# Patient Record
Sex: Female | Born: 1945 | Race: White | Hispanic: No | Marital: Married | Smoking: Former smoker
Health system: Southern US, Community
[De-identification: ages and names within clinical notes are randomized; demographics above are authoritative.]

## PROBLEM LIST (undated history)

## (undated) DIAGNOSIS — K219 Gastro-esophageal reflux disease without esophagitis: Secondary | ICD-10-CM

## (undated) DIAGNOSIS — K3184 Gastroparesis: Secondary | ICD-10-CM

## (undated) DIAGNOSIS — E079 Disorder of thyroid, unspecified: Secondary | ICD-10-CM

## (undated) DIAGNOSIS — D649 Anemia, unspecified: Secondary | ICD-10-CM

## (undated) DIAGNOSIS — D219 Benign neoplasm of connective and other soft tissue, unspecified: Secondary | ICD-10-CM

## (undated) DIAGNOSIS — I10 Essential (primary) hypertension: Secondary | ICD-10-CM

## (undated) DIAGNOSIS — E039 Hypothyroidism, unspecified: Secondary | ICD-10-CM

## (undated) DIAGNOSIS — K6389 Other specified diseases of intestine: Secondary | ICD-10-CM

## (undated) DIAGNOSIS — C801 Malignant (primary) neoplasm, unspecified: Secondary | ICD-10-CM

## (undated) DIAGNOSIS — F419 Anxiety disorder, unspecified: Secondary | ICD-10-CM

## (undated) DIAGNOSIS — K638219 Small intestinal bacterial overgrowth, unspecified: Secondary | ICD-10-CM

## (undated) DIAGNOSIS — E119 Type 2 diabetes mellitus without complications: Secondary | ICD-10-CM

## (undated) DIAGNOSIS — F4310 Post-traumatic stress disorder, unspecified: Secondary | ICD-10-CM

## (undated) DIAGNOSIS — F32A Depression, unspecified: Secondary | ICD-10-CM

## (undated) DIAGNOSIS — F329 Major depressive disorder, single episode, unspecified: Secondary | ICD-10-CM

## (undated) DIAGNOSIS — N289 Disorder of kidney and ureter, unspecified: Secondary | ICD-10-CM

## (undated) HISTORY — PX: PARS PLANA VITRECTOMY: SHX2166

## (undated) HISTORY — DX: Type 2 diabetes mellitus without complications: E11.9

## (undated) HISTORY — DX: Small intestinal bacterial overgrowth, unspecified: K63.8219

## (undated) HISTORY — DX: Post-traumatic stress disorder, unspecified: F43.10

## (undated) HISTORY — DX: Other specified diseases of intestine: K63.89

## (undated) HISTORY — DX: Anxiety disorder, unspecified: F41.9

## (undated) HISTORY — DX: Disorder of thyroid, unspecified: E07.9

## (undated) HISTORY — DX: Disorder of kidney and ureter, unspecified: N28.9

## (undated) HISTORY — PX: CATARACT EXTRACTION: SUR2

## (undated) HISTORY — DX: Benign neoplasm of connective and other soft tissue, unspecified: D21.9

## (undated) HISTORY — PX: ROTATOR CUFF REPAIR: SHX139

## (undated) HISTORY — PX: ENDOMETRIAL ABLATION: SHX621

## (undated) HISTORY — DX: Gastroparesis: K31.84

## (undated) HISTORY — DX: Malignant (primary) neoplasm, unspecified: C80.1

## (undated) HISTORY — PX: CARPAL TUNNEL RELEASE: SHX101

## (undated) HISTORY — DX: Anemia, unspecified: D64.9

## (undated) HISTORY — DX: Essential (primary) hypertension: I10

---

## 1996-12-24 HISTORY — PX: BREAST BIOPSY: SHX20

## 1997-06-27 ENCOUNTER — Ambulatory Visit (HOSPITAL_COMMUNITY): Admission: RE | Admit: 1997-06-27 | Discharge: 1997-06-27 | Payer: Self-pay | Admitting: *Deleted

## 1997-07-12 ENCOUNTER — Ambulatory Visit (HOSPITAL_COMMUNITY): Admission: RE | Admit: 1997-07-12 | Discharge: 1997-07-12 | Payer: Self-pay | Admitting: *Deleted

## 1997-08-24 ENCOUNTER — Encounter: Admission: RE | Admit: 1997-08-24 | Discharge: 1997-11-22 | Payer: Self-pay | Admitting: Orthopaedic Surgery

## 1997-09-05 ENCOUNTER — Encounter: Admission: RE | Admit: 1997-09-05 | Discharge: 1997-12-04 | Payer: Self-pay | Admitting: Internal Medicine

## 1998-02-16 ENCOUNTER — Ambulatory Visit (HOSPITAL_COMMUNITY): Admission: RE | Admit: 1998-02-16 | Discharge: 1998-02-16 | Payer: Self-pay | Admitting: *Deleted

## 1998-07-02 ENCOUNTER — Other Ambulatory Visit: Admission: RE | Admit: 1998-07-02 | Discharge: 1998-07-02 | Payer: Self-pay | Admitting: *Deleted

## 1998-07-20 ENCOUNTER — Ambulatory Visit (HOSPITAL_COMMUNITY): Admission: RE | Admit: 1998-07-20 | Discharge: 1998-07-20 | Payer: Self-pay | Admitting: Ophthalmology

## 1998-07-20 ENCOUNTER — Encounter: Payer: Self-pay | Admitting: Ophthalmology

## 1998-09-11 ENCOUNTER — Ambulatory Visit (HOSPITAL_COMMUNITY): Admission: RE | Admit: 1998-09-11 | Discharge: 1998-09-11 | Payer: Self-pay | Admitting: Cardiology

## 1999-01-25 ENCOUNTER — Ambulatory Visit (HOSPITAL_COMMUNITY): Admission: RE | Admit: 1999-01-25 | Discharge: 1999-01-26 | Payer: Self-pay | Admitting: Ophthalmology

## 1999-02-19 ENCOUNTER — Ambulatory Visit (HOSPITAL_COMMUNITY): Admission: RE | Admit: 1999-02-19 | Discharge: 1999-02-19 | Payer: Self-pay | Admitting: *Deleted

## 1999-02-19 ENCOUNTER — Encounter: Payer: Self-pay | Admitting: *Deleted

## 1999-04-08 ENCOUNTER — Other Ambulatory Visit: Admission: RE | Admit: 1999-04-08 | Discharge: 1999-04-08 | Payer: Self-pay | Admitting: *Deleted

## 1999-10-01 ENCOUNTER — Ambulatory Visit (HOSPITAL_BASED_OUTPATIENT_CLINIC_OR_DEPARTMENT_OTHER): Admission: RE | Admit: 1999-10-01 | Discharge: 1999-10-01 | Payer: Self-pay | Admitting: Orthopaedic Surgery

## 2000-02-20 ENCOUNTER — Encounter: Payer: Self-pay | Admitting: *Deleted

## 2000-02-20 ENCOUNTER — Ambulatory Visit (HOSPITAL_COMMUNITY): Admission: RE | Admit: 2000-02-20 | Discharge: 2000-02-20 | Payer: Self-pay | Admitting: *Deleted

## 2000-02-26 ENCOUNTER — Encounter: Admission: RE | Admit: 2000-02-26 | Discharge: 2000-02-26 | Payer: Self-pay | Admitting: *Deleted

## 2000-02-26 ENCOUNTER — Encounter: Payer: Self-pay | Admitting: *Deleted

## 2000-03-05 ENCOUNTER — Ambulatory Visit (HOSPITAL_BASED_OUTPATIENT_CLINIC_OR_DEPARTMENT_OTHER): Admission: RE | Admit: 2000-03-05 | Discharge: 2000-03-05 | Payer: Self-pay | Admitting: Orthopaedic Surgery

## 2000-03-09 ENCOUNTER — Other Ambulatory Visit: Admission: RE | Admit: 2000-03-09 | Discharge: 2000-03-09 | Payer: Self-pay | Admitting: *Deleted

## 2000-04-09 ENCOUNTER — Ambulatory Visit (HOSPITAL_COMMUNITY): Admission: RE | Admit: 2000-04-09 | Discharge: 2000-04-09 | Payer: Self-pay | Admitting: *Deleted

## 2000-08-20 ENCOUNTER — Encounter: Admission: RE | Admit: 2000-08-20 | Discharge: 2000-08-20 | Payer: Self-pay | Admitting: Orthopaedic Surgery

## 2000-08-20 ENCOUNTER — Encounter: Payer: Self-pay | Admitting: Orthopaedic Surgery

## 2000-11-05 ENCOUNTER — Ambulatory Visit (HOSPITAL_BASED_OUTPATIENT_CLINIC_OR_DEPARTMENT_OTHER): Admission: RE | Admit: 2000-11-05 | Discharge: 2000-11-05 | Payer: Self-pay | Admitting: Orthopaedic Surgery

## 2001-02-19 ENCOUNTER — Other Ambulatory Visit: Admission: RE | Admit: 2001-02-19 | Discharge: 2001-02-19 | Payer: Self-pay | Admitting: *Deleted

## 2001-02-26 ENCOUNTER — Encounter: Payer: Self-pay | Admitting: *Deleted

## 2001-02-26 ENCOUNTER — Encounter: Admission: RE | Admit: 2001-02-26 | Discharge: 2001-02-26 | Payer: Self-pay | Admitting: *Deleted

## 2001-12-30 ENCOUNTER — Encounter: Payer: Self-pay | Admitting: Ophthalmology

## 2001-12-31 ENCOUNTER — Ambulatory Visit (HOSPITAL_COMMUNITY): Admission: RE | Admit: 2001-12-31 | Discharge: 2001-12-31 | Payer: Self-pay | Admitting: Ophthalmology

## 2002-01-27 ENCOUNTER — Other Ambulatory Visit: Admission: RE | Admit: 2002-01-27 | Discharge: 2002-01-27 | Payer: Self-pay | Admitting: *Deleted

## 2002-03-08 ENCOUNTER — Encounter: Payer: Self-pay | Admitting: *Deleted

## 2002-03-08 ENCOUNTER — Encounter: Admission: RE | Admit: 2002-03-08 | Discharge: 2002-03-08 | Payer: Self-pay | Admitting: *Deleted

## 2003-02-08 ENCOUNTER — Other Ambulatory Visit: Admission: RE | Admit: 2003-02-08 | Discharge: 2003-02-08 | Payer: Self-pay | Admitting: *Deleted

## 2003-03-14 ENCOUNTER — Encounter: Admission: RE | Admit: 2003-03-14 | Discharge: 2003-03-14 | Payer: Self-pay | Admitting: *Deleted

## 2003-03-14 ENCOUNTER — Encounter: Payer: Self-pay | Admitting: *Deleted

## 2004-02-15 ENCOUNTER — Other Ambulatory Visit: Admission: RE | Admit: 2004-02-15 | Discharge: 2004-02-15 | Payer: Self-pay | Admitting: *Deleted

## 2004-03-15 ENCOUNTER — Encounter: Admission: RE | Admit: 2004-03-15 | Discharge: 2004-03-15 | Payer: Self-pay | Admitting: *Deleted

## 2005-03-27 ENCOUNTER — Encounter: Admission: RE | Admit: 2005-03-27 | Discharge: 2005-03-27 | Payer: Self-pay | Admitting: Internal Medicine

## 2005-07-18 ENCOUNTER — Ambulatory Visit (HOSPITAL_COMMUNITY): Admission: RE | Admit: 2005-07-18 | Discharge: 2005-07-18 | Payer: Self-pay | Admitting: Gastroenterology

## 2005-09-24 ENCOUNTER — Encounter: Payer: Self-pay | Admitting: *Deleted

## 2005-11-03 ENCOUNTER — Emergency Department (HOSPITAL_COMMUNITY): Admission: EM | Admit: 2005-11-03 | Discharge: 2005-11-03 | Payer: Self-pay | Admitting: Emergency Medicine

## 2005-11-21 ENCOUNTER — Ambulatory Visit (HOSPITAL_COMMUNITY): Admission: RE | Admit: 2005-11-21 | Discharge: 2005-11-21 | Payer: Self-pay | Admitting: Gastroenterology

## 2006-01-21 ENCOUNTER — Ambulatory Visit (HOSPITAL_COMMUNITY): Admission: RE | Admit: 2006-01-21 | Discharge: 2006-01-21 | Payer: Self-pay | Admitting: Gastroenterology

## 2006-01-23 ENCOUNTER — Ambulatory Visit (HOSPITAL_COMMUNITY): Admission: RE | Admit: 2006-01-23 | Discharge: 2006-01-23 | Payer: Self-pay | Admitting: Gastroenterology

## 2006-01-23 ENCOUNTER — Encounter (INDEPENDENT_AMBULATORY_CARE_PROVIDER_SITE_OTHER): Payer: Self-pay | Admitting: Specialist

## 2006-02-18 ENCOUNTER — Other Ambulatory Visit: Admission: RE | Admit: 2006-02-18 | Discharge: 2006-02-18 | Payer: Self-pay | Admitting: Obstetrics & Gynecology

## 2006-03-30 ENCOUNTER — Encounter: Admission: RE | Admit: 2006-03-30 | Discharge: 2006-03-30 | Payer: Self-pay | Admitting: Obstetrics & Gynecology

## 2006-07-21 ENCOUNTER — Ambulatory Visit (HOSPITAL_COMMUNITY): Admission: RE | Admit: 2006-07-21 | Discharge: 2006-07-21 | Payer: Self-pay | Admitting: Gastroenterology

## 2007-03-05 ENCOUNTER — Other Ambulatory Visit: Admission: RE | Admit: 2007-03-05 | Discharge: 2007-03-05 | Payer: Self-pay | Admitting: Obstetrics & Gynecology

## 2007-03-10 ENCOUNTER — Encounter: Admission: RE | Admit: 2007-03-10 | Discharge: 2007-03-10 | Payer: Self-pay | Admitting: Obstetrics & Gynecology

## 2007-05-31 ENCOUNTER — Encounter: Admission: RE | Admit: 2007-05-31 | Discharge: 2007-05-31 | Payer: Self-pay | Admitting: Obstetrics & Gynecology

## 2008-01-19 ENCOUNTER — Emergency Department (HOSPITAL_COMMUNITY): Admission: EM | Admit: 2008-01-19 | Discharge: 2008-01-19 | Payer: Self-pay | Admitting: *Deleted

## 2008-05-01 ENCOUNTER — Other Ambulatory Visit: Admission: RE | Admit: 2008-05-01 | Discharge: 2008-05-01 | Payer: Self-pay | Admitting: Obstetrics & Gynecology

## 2008-06-01 ENCOUNTER — Encounter: Admission: RE | Admit: 2008-06-01 | Discharge: 2008-06-01 | Payer: Self-pay | Admitting: Obstetrics & Gynecology

## 2008-10-09 ENCOUNTER — Emergency Department (HOSPITAL_COMMUNITY): Admission: EM | Admit: 2008-10-09 | Discharge: 2008-10-09 | Payer: Self-pay | Admitting: Emergency Medicine

## 2009-05-05 ENCOUNTER — Ambulatory Visit (HOSPITAL_COMMUNITY): Admission: RE | Admit: 2009-05-05 | Discharge: 2009-05-05 | Payer: Self-pay | Admitting: Orthopaedic Surgery

## 2009-07-05 ENCOUNTER — Encounter: Admission: RE | Admit: 2009-07-05 | Discharge: 2009-07-05 | Payer: Self-pay | Admitting: Internal Medicine

## 2009-07-13 ENCOUNTER — Encounter: Admission: RE | Admit: 2009-07-13 | Discharge: 2009-07-13 | Payer: Self-pay | Admitting: Internal Medicine

## 2009-07-23 ENCOUNTER — Ambulatory Visit (HOSPITAL_COMMUNITY): Admission: RE | Admit: 2009-07-23 | Discharge: 2009-07-23 | Payer: Self-pay | Admitting: Internal Medicine

## 2009-08-09 ENCOUNTER — Encounter: Admission: RE | Admit: 2009-08-09 | Discharge: 2009-08-09 | Payer: Self-pay | Admitting: Surgery

## 2009-08-09 ENCOUNTER — Ambulatory Visit (HOSPITAL_BASED_OUTPATIENT_CLINIC_OR_DEPARTMENT_OTHER): Admission: RE | Admit: 2009-08-09 | Discharge: 2009-08-09 | Payer: Self-pay | Admitting: Surgery

## 2010-06-16 ENCOUNTER — Encounter: Payer: Self-pay | Admitting: Gastroenterology

## 2010-08-16 LAB — BASIC METABOLIC PANEL
BUN: 21 mg/dL (ref 6–23)
CO2: 24 mEq/L (ref 19–32)
Calcium: 8.8 mg/dL (ref 8.4–10.5)
Chloride: 105 mEq/L (ref 96–112)
Creatinine, Ser: 1.14 mg/dL (ref 0.4–1.2)
GFR calc Af Amer: 58 mL/min — ABNORMAL LOW (ref >60)
GFR calc non Af Amer: 48 mL/min — ABNORMAL LOW (ref >60)
Glucose, Bld: 228 mg/dL — ABNORMAL HIGH (ref 70–99)
Potassium: 4.9 mEq/L (ref 3.5–5.1)
Sodium: 134 mEq/L — ABNORMAL LOW (ref 135–145)

## 2010-08-16 LAB — POCT HEMOGLOBIN-HEMACUE: Hemoglobin: 14.1 g/dL (ref 12.0–15.0)

## 2010-08-16 LAB — GLUCOSE, CAPILLARY: Glucose-Capillary: 181 mg/dL — ABNORMAL HIGH (ref 70–99)

## 2010-10-11 NOTE — Op Note (Signed)
NAME:  KARL, STAUNTON                ACCOUNT NO.:  [id number]   MEDICAL RECORD NO.:  [id number]          PATIENT TYPE:  AMB   LOCATION:  ENDO                         FACILITY:  [facility]   PHYSICIAN:  Lear Ng, MDDATE OF BIRTH:  01/16/1946   DATE OF PROCEDURE:  11/21/2005  DATE OF DISCHARGE:                                 OPERATIVE REPORT   PROCEDURE:  Colonoscopy.   INDICATIONS:  Family history of colon cancer in her grandmother.   MEDICATIONS:  Fentanyl 25 mcg IV, additional medicines given for the  preceding EGD.   FINDINGS:  Rectal exam was normal.  An adult adjustable colonoscope was  inserted into a poorly prepped colon and advanced to 30 cm which continued  to reveal semisolid and solid stool throughout the colon.  Due to the poor  prep, the decision was made to terminate the procedure at this time.   ASSESSMENT:  Poor prep.   PLAN:  Reschedule with TriLyte plus Amitiza or TriLyte plus magnesium  citrate at a later date.      Lear Ng, MD  Electronically Signed     VCS/MEDQ  D:  11/21/2005  T:  11/21/2005  Job:  [id number]   cc:   Haywood Pao, M.D.  Fax: [phone]

## 2010-10-11 NOTE — Op Note (Signed)
[place]  Patient:    Jasmin Simmons, Jasmin Simmons                       MRN: [id number] Proc. Date: 10/01/99 Adm. Date:  [id number] Attending:  Reinaldo Berber                           Operative Report  PREOPERATIVE DIAGNOSIS:  Right ring trigger finger.  POSTOPERATIVE DIAGNOSIS:  Right ring trigger finger.  OPERATION PERFORMED:  Release of A1 pulley of the flexor tendon sheath, right ring finger.  SURGEON:  Vonna Kotyk. Durward Fortes, M.D.  ANESTHESIA:  1% local Xylocaine.  COMPLICATIONS:  None.  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 She has active triggering with a painful nodule over the flexor tendon sheath at the level of the distal palmar flexion crease.  DESCRIPTION OF PROCEDURE:  With the patient comfortable on the operating table, the right hand was then prepped with DuraPrep from the tips of the fingers to the midforearm.  Sterile draping was performed.  1% Xylocaine was then infiltrated along the distal palmar flexion crease of the ring finger. Then I used 1% Xylocaine with 1:100 epinephrine just underneath the skin for hemostasis.  About a 1/2 inch incision was made along the flexion crease and by sharp dissection carried down to subcutaneous tissues.  Then by blunt dissection, the soft tissue was elevated longitudinally.  There was one small bleeder and I applied a hemostat for hemostasis.  I could then visualize the flexor tendon and its sheath.  Under direct visualization using a right angle retractor, a small incision was sharply made into the flexor tendon sheath and then the scissors were used to release the A1 pulley more proximally.  At that point she could flex and extend the finger without any sensation of the finger catching or  clicking, nor could she actively trigger the finger.  The wound was then copiously irrigated with saline solution.  The skin was closed with 4-0 Ethilon.  Sterile bulky dressing was applied followed by Coban.  The patient tolerated the procedure well without complications.  PLAN:  Hydrocodone for pain.  Office on week. DD:  10/01/99 TD:  10/02/99 Job: [id number]

## 2010-10-11 NOTE — Op Note (Signed)
NAMEADALIZ, Jasmin Simmons                ACCOUNT NO.:  [id number]   MEDICAL RECORD NO.:  [id number]          PATIENT TYPE:  AMB   LOCATION:  ENDO                         FACILITY:  [facility]   PHYSICIAN:  Lear Ng, MDDATE OF BIRTH:  [birth year]   DATE OF PROCEDURE:  01/21/2006  DATE OF DISCHARGE:                                 OPERATIVE REPORT   PROCEDURE:  Sigmoidoscopy.   INDICATIONS:  Screening.   MEDICATIONS:  Fentanyl 100 mcg IV, Versed 10 mg IV.   PROCEDURE:  Rectal exam was normal.  An adult adjustable colonoscope was  inserted into a poorly prepped colon where a large amount of solid stool was  noted in the distal colon.  This area was traversed with the colonoscope up  to 60 cm where large amount of solid stool prevented further passage of the  scope.  Despite irrigation and suctioning the solid stool did not break down  and the procedure was terminated.   ASSESSMENT:  Poor prep.   PLAN:  Repeat procedure in 48 hours after giving more prep today and  tomorrow.      Lear Ng, MD  Electronically Signed     VCS/MEDQ  D:  01/21/2006  T:  01/22/2006  Job:  [id number]   cc:   Haywood Pao, M.D.

## 2010-10-11 NOTE — Op Note (Signed)
NAME:  Jasmin Simmons, Jasmin Simmons                ACCOUNT NO.:  [id number]   MEDICAL RECORD NO.:  [id number]          PATIENT TYPE:  AMB   LOCATION:  ENDO                         FACILITY:  [facility]   PHYSICIAN:  Lear Ng, MDDATE OF BIRTH:  13-Jan-1946   DATE OF PROCEDURE:  11/21/2005  DATE OF DISCHARGE:                                 OPERATIVE REPORT   PROCEDURE:  Upper endoscopy.   INDICATION:  Chest pain.   MEDICATIONS:  Fentanyl 50 mcg IV, Versed 10 mg IV.   FINDINGS:  Endoscope was inserted into the oropharynx and esophagus was  intubated, which was normal in its entirety, and the gastroesophageal  junction was approximately 36 cm from incisors.  Endoscope was advanced down  into the stomach, which revealed patchy areas of linear erythematous streaks  in the antrum that were very mild, consistent with antral gastritis.  No  ulcers or other mucosal abnormalities were seen.  Retroflexion was done,  which revealed normal cardia, fundus and angularis.  Endoscope was  straightened and advanced down to the duodenal bulb and second portion of  duodenum, which were both normal.   ASSESSMENT:  Mild antral gastritis, otherwise normal EGD.   PLAN:  Continue proton pump inhibitor and lifestyle modifications.      Lear Ng, MD  Electronically Signed     VCS/MEDQ  D:  11/21/2005  T:  11/21/2005  Job:  [id number]   cc:   Haywood Pao, M.D.  Fax: [phone]

## 2010-10-11 NOTE — H&P (Signed)
NAME:  Jasmin Simmons, Jasmin Simmons                          ACCOUNT NO.:  [id number]   MEDICAL RECORD NO.:  [id number]                   PATIENT TYPE:  OIB   LOCATION:  NA                                   FACILITY:  [facility]   PHYSICIAN:  Garey Ham, M.D.             DATE OF BIRTH:  [birth year]   DATE OF ADMISSION:  DATE OF DISCHARGE:                                HISTORY & PHYSICAL   REASON FOR ADMISSION:  This was a planned outpatient readmission of this 65-  year-old insulin-dependent diabetic admitted for cataract implant surgery of  the left eye.   HISTORY OF PRESENT ILLNESS:  This patient has a long history of insulin-  dependent diabetes mellitus with secondary complications of diabetic  proliferative retinopathy and cataract formation.  She has undergone  previous laser panretinal photocoagulation to both eyes and posterior  vitrectomy surgery with membrane peeling to both eyes.  Last vitrectomy  surgery on the left eye was performed on July 20, 1998 and a repeat  vitrectomy on January 25, 1999.  Gradual nuclear cataract formation  developed after a vitrectomy surgery of both eyes and the patient was  previously admitted on April 03, 1997 for cataract implant surgery of the  right eye.  The patient has done well following the surgical procedure and  is now admitted for similar cataract implant surgery of the left eye.  The  patient has been given oral discussion and printed information concerning  the procedure and its possible complications.  She signed an informed  consent and arrangements made for her outpatient admission at this time.   PAST MEDICAL HISTORY:  Patient continues under the care of Dr. Osborne Casco.  Patient is felt to be in good condition for the proposed surgery.  Patient  has an insulin pump.  Recently the blood sugar levels have been more  elevated than usual.  Patient takes other current medications under Dr.  Loren Racer direction including Synthroid,  Lipitor, and other medications.  She is said to have no known allergies expect TobraDex Ophthalmic Ointment  and Drop and Maxitrol Ophthalmic Ointment and Drop.   REVIEW OF SYSTEMS:  No current cardiorespiratory complaints.   PHYSICAL EXAMINATION:  VITAL SIGNS:  As recorded on admission: Blood  pressure 146/76.  Temperature 97.9, heart rate 78, respirations 20.  GENERAL APPEARANCE: Patient is a pleasant, well-nourished, well-developed,  65 year old, white female in no acute distress.  HEENT: Eyes: Visual acuity as recorded: 20/50 left eye, 20/50 right eye.  Applanation tonometry 18 mm each eye.  External ocular and slit lamp  examination: The eyes are white and clear.  There is a well-centered clear  posterior chamber interocular lens implant of the right eye and nuclear  cataract in the left eye.  Detailed fundus examination reveals a clear  vitreous attached retina with extensive panretinal laser photocoagulation.  Macular area is clear and there is no active diabetic retinopathy at  this  time.  CHEST: Lungs clear to percussion and auscultation.  HEART: Normal sinus rhythm.  No cardiomegaly.  No murmurs.  ABDOMEN: Negative.  EXTREMITIES: Negative.   ADMISSION DIAGNOSES:  1. Nuclear cataract left eye.  2.     Diabetic proliferative retinopathy both eyes.  3. Insulin-dependent diabetes mellitus.   SURGICAL PLAN:  Cataract implant surgery left eye.                                                Garey Ham, M.D.    HNJ/MEDQ  D:  12/31/2001  T:  01/04/2002  Job:  [id number]   cc:   Haywood Pao, M.D.

## 2010-10-11 NOTE — Op Note (Signed)
NAME:  Jasmin Simmons, Jasmin Simmons                          ACCOUNT NO.:  [id number]   MEDICAL RECORD NO.:  [id number]                   PATIENT TYPE:  OIB   LOCATION:  NA                                   FACILITY:  [facility]   PHYSICIAN:  Garey Ham, M.D.             DATE OF BIRTH:  Apr 15, 1946   DATE OF PROCEDURE:  12/31/2001  DATE OF DISCHARGE:                                 OPERATIVE REPORT   PREOPERATIVE DIAGNOSES:  1. Nuclear cataract left eye.  2. Proliferative diabetic retinopathy left eye.   POSTOPERATIVE DIAGNOSES:  1. Nuclear cataract left eye.  2. Proliferative diabetic retinopathy left eye.   NAME OF OPERATION:  Planned extracapsular cataract extraction--  phacoemulsification, primary insertion of posterior chamber interocular lens  implant.  Surgeon: Ishmael Holter.  Assistant: Nurse.  Anesthesia: Local 4%  Xylocaine, 0.75 Marcaine, retrobulbar bi-block, topical tetracaine,  interocular Xylocaine.  Anesthesia standby required on this diabetic  patient.  Patient given intravenous Sodium Pentothal during the period of  retrobulbar bi-blocking.   OPERATIVE PROCEDURE:  After the patient was prepped and draped the lid  speculum was inserted in the left eye.  Schiotz tonometry was recorded at 4-  5 scaled units with a 5.5 gram weight.  peritomy was performed adjacent to  the limbus from the 11 to 1 o'clock position.  The corneoscleral junction  was clean and a corneoscleral groove made approximately 6 mm in length.  The  antechamber was then entered with a 2.5 mm diamond keratome at the 12  o'clock position and a 15-degree blade at the 2:30 position. Using a 26-  guage needle on a Healon syringe a circular capsulorrhexis was begun and  then completed with the Grabow forceps.  Hydrodissection and  hydrodelineation were performed using 1% Xylocaine.  A 30 degree  phacoemulsification tip was then inserted with slow controlled  emulsification of the lens nucleus.  Total ultrasonic time 39  seconds,  average power level 15%, total amount of fluid used 50 cc.  Following  removal of the nucleus, the residual cortex was aspirated with the  irrigation aspiration tip.  The posterior capsule appeared intact with  brilliant red fundus reflex.  Was therefore elected to insert an [place] [id number] and B. silicone 3-piece posterior chamber interocular  lens implant with UV absorber.  Diopter strength +20.50.  This was a  Bausch&Lomb posterior chamber interocular lens implant model EZ-60.  Diopter  strength +20.50.  Length 12.75 mm.  This implant was inserted with a  Shepherd forceps through the slightly enlarged 12 o'clock incision to  accommodate the 6 mm optic.  The lens was then centered into the capsular  bag using the [facility] lens rotator.  The lens appeared to be well centered.  The Healon which had been used throughout the procedure was aspirated and  replaced with balanced salt solution and Miochol Opthalmic solution.  Four  10-0 interrupted  nylon sutures closed the 6-mm incision.  The site port  incision was self-sealing.  A light patch and protector shield were applied  to the operated left eye.  Duration of procedure and anesthesia  administration 45 minutes.  Patient tolerated the procedure well in general,  left the operating room for the recovery room in good condition.                                                Garey Ham, M.D.    HNJ/MEDQ  D:  12/31/2001  T:  01/04/2002  Job:  [id number]

## 2010-10-11 NOTE — Op Note (Signed)
NAME:  Jasmin Simmons, Jasmin Simmons                ACCOUNT NO.:  [id number]   MEDICAL RECORD NO.:  [id number]          PATIENT TYPE:  AMB   LOCATION:  ENDO                         FACILITY:  [facility]   PHYSICIAN:  Lear Ng, MDDATE OF BIRTH:  21-Jun-1945   DATE OF PROCEDURE:  01/23/2006  DATE OF DISCHARGE:                                 OPERATIVE REPORT   PROCEDURE:  Colonoscopy.   INDICATIONS FOR PROCEDURE:  Screening.   MEDICATIONS:  Fentanyl 100 mcg IV, Versed 10 mg IV.   FINDINGS:  Rectal exam was normal.  An adult adjustable colonoscope was  inserted through a fair prepped colon and advanced to the cecum where the  ileocecal valve and appendiceal orifice were identified.  The procedure was  prolonged due to a large amount of semi-liquid stool that was suctioned and  aspirated to allow adequate view of the colon.  On careful withdrawal of the  colonoscope, this revealed a 3 mm sessile sigmoid polyp that was removed  with snare cautery.  No other mucosal abnormalities were seen.  Retroflexion  was unremarkable.   ASSESSMENT:  Small sigmoid polyp removed with snare cautery.   PLAN:  1. Repeat colonoscopy in three years.  2. No aspirin products x 14 days.  3. Continue Amatiza twice a day for chronic constipation.      Lear Ng, MD  Electronically Signed     VCS/MEDQ  D:  01/23/2006  T:  01/24/2006  Job:  [id number]   cc:   Haywood Pao, M.D.

## 2010-10-11 NOTE — Op Note (Signed)
[place]  Patient:    Jasmin Simmons, Jasmin Simmons                       MRN: [id number] Proc. Date: 11/05/00 Adm. Date:  [id number] Attending:  Reinaldo Berber                           Operative Report  PREOPERATIVE DIAGNOSIS:  Left long trigger finger.  POSTOPERATIVE DIAGNOSIS:  Left long trigger finger.  OPERATION PERFORMED:  A-1 pulley release, left long finger.  SURGEON:  Vonna Kotyk. Durward Fortes, M.D.  ANESTHESIA:  MAC with local 1% Xylocaine.  COMPLICATIONS:  None.  INDICATIONS FOR PROCEDURE:  The patient is a 65 year old diabetic female who has had several previous trigger finger releases of both hands and has done quite well.  She recently has developed triggering of the left long finger to the point of compromise, now to have a surgical release.  DESCRIPTION OF PROCEDURE:  With the patient comfortable on the operating table and under minimal IV sedation, the left upper extremity was prepped with DuraPrep from the tips of the fingers to the elbow.  Sterile draping was performed.  A skin incision was outlined along the longitudinal palmar crease distally over the small mass at the flexor tendon of the long finger.  About a 3/8 inch incision was then made along the outlined incision and carried down to subcutaneous tissues.  Then by blunt dissection, the soft tissue was elevated off the flexor tendon sheath.  Blunt retractors were carefully inserted.  The tendon sheath was identified.  It was thickened.  A small incision was made longitudinally with a 15 blade knife and the A-1 pulley was released with a pair of blunt-nosed scissors under direct visualization.  The pulley was completely released such that the patient could no longer trigger the finger.  The incision was then closed with 4-0 Ethilon.  A sterile bulky dressing was applied.  The patient was returned to the post anesthesia recovery room in satisfactory condition.  PLAN:   Hydrocodone for pain.  Office one week. DD:  11/05/00 TD:  11/05/00 Job: [id number]

## 2012-10-13 ENCOUNTER — Ambulatory Visit: Payer: Self-pay | Admitting: Obstetrics & Gynecology

## 2012-10-15 ENCOUNTER — Encounter: Payer: Self-pay | Admitting: Obstetrics & Gynecology

## 2012-10-15 ENCOUNTER — Ambulatory Visit: Payer: Self-pay | Admitting: Obstetrics & Gynecology

## 2012-10-15 DIAGNOSIS — Z01419 Encounter for gynecological examination (general) (routine) without abnormal findings: Secondary | ICD-10-CM

## 2013-02-10 DIAGNOSIS — H401190 Primary open-angle glaucoma, unspecified eye, stage unspecified: Secondary | ICD-10-CM | POA: Insufficient documentation

## 2013-04-14 DIAGNOSIS — Z961 Presence of intraocular lens: Secondary | ICD-10-CM | POA: Insufficient documentation

## 2013-04-29 DIAGNOSIS — Z96652 Presence of left artificial knee joint: Secondary | ICD-10-CM | POA: Insufficient documentation

## 2014-01-16 DIAGNOSIS — H57819 Brow ptosis, unspecified: Secondary | ICD-10-CM | POA: Insufficient documentation

## 2014-01-16 DIAGNOSIS — H02839 Dermatochalasis of unspecified eye, unspecified eyelid: Secondary | ICD-10-CM | POA: Insufficient documentation

## 2014-03-27 ENCOUNTER — Encounter: Payer: Self-pay | Admitting: Obstetrics & Gynecology

## 2014-05-26 HISTORY — PX: REPLACEMENT TOTAL KNEE: SUR1224

## 2014-09-12 DIAGNOSIS — H9042 Sensorineural hearing loss, unilateral, left ear, with unrestricted hearing on the contralateral side: Secondary | ICD-10-CM | POA: Insufficient documentation

## 2014-09-12 DIAGNOSIS — IMO0001 Reserved for inherently not codable concepts without codable children: Secondary | ICD-10-CM | POA: Insufficient documentation

## 2014-10-16 DIAGNOSIS — E119 Type 2 diabetes mellitus without complications: Secondary | ICD-10-CM | POA: Insufficient documentation

## 2015-05-14 DIAGNOSIS — H35373 Puckering of macula, bilateral: Secondary | ICD-10-CM | POA: Insufficient documentation

## 2015-05-14 DIAGNOSIS — H35379 Puckering of macula, unspecified eye: Secondary | ICD-10-CM | POA: Insufficient documentation

## 2015-05-14 DIAGNOSIS — E113599 Type 2 diabetes mellitus with proliferative diabetic retinopathy without macular edema, unspecified eye: Secondary | ICD-10-CM | POA: Insufficient documentation

## 2015-11-14 DIAGNOSIS — M19049 Primary osteoarthritis, unspecified hand: Secondary | ICD-10-CM | POA: Insufficient documentation

## 2015-11-14 DIAGNOSIS — M6530 Trigger finger, unspecified finger: Secondary | ICD-10-CM | POA: Insufficient documentation

## 2015-11-14 DIAGNOSIS — M79642 Pain in left hand: Secondary | ICD-10-CM | POA: Insufficient documentation

## 2015-11-16 ENCOUNTER — Other Ambulatory Visit: Payer: Self-pay | Admitting: Orthopedic Surgery

## 2015-11-26 ENCOUNTER — Encounter: Payer: Self-pay | Admitting: Podiatry

## 2015-11-26 ENCOUNTER — Ambulatory Visit (INDEPENDENT_AMBULATORY_CARE_PROVIDER_SITE_OTHER): Payer: Medicare Other | Admitting: Podiatry

## 2015-11-26 DIAGNOSIS — L603 Nail dystrophy: Secondary | ICD-10-CM | POA: Diagnosis not present

## 2015-11-26 DIAGNOSIS — B351 Tinea unguium: Secondary | ICD-10-CM | POA: Diagnosis not present

## 2015-11-26 NOTE — Progress Notes (Signed)
   Subjective:    Patient ID: Jasmin Simmons, female    DOB: [birth year], 70 y.o.   MRN: [id number]  HPI  Chief Complaint  Patient presents with  . Nail Problem    1st toenail left - thick, discolored nail, noticed another nail starting to grow underneath, just wanted it checked      Review of Systems  All other systems reviewed and are negative.      Objective:   Physical Exam        Assessment & Plan:

## 2015-11-28 ENCOUNTER — Encounter (HOSPITAL_BASED_OUTPATIENT_CLINIC_OR_DEPARTMENT_OTHER): Payer: Self-pay | Admitting: *Deleted

## 2015-11-28 NOTE — Progress Notes (Signed)
Subjective:     Patient ID: Jasmin Simmons, female   DOB: 1945/12/25, 70 y.o.   MRN: [id number]  HPI patient presents stating she is a diabetic and she was just concerned about the condition of her right big toenail   Review of Systems  All other systems reviewed and are negative.      Objective:   Physical Exam  Constitutional: She is oriented to person, place, and time.  Cardiovascular: Intact distal pulses.   Musculoskeletal: Normal range of motion.  Neurological: She is oriented to person, place, and time.  Skin: Skin is warm.  Nursing note and vitals reviewed.  neurovascular status intact muscle strength adequate range of motion within normal limits with patient found to have a moderate trauma of the right hallux nail that's got a small folded but localized with no edema erythema or drainage noted     Assessment:     Appears to be a localized traumatic process to the right hallux nail    Plan:     Advised on trimming it and reducing the pressure and using wider shoes and if it should start to get red draining or get loose she will come back in for check

## 2015-11-29 ENCOUNTER — Encounter (HOSPITAL_BASED_OUTPATIENT_CLINIC_OR_DEPARTMENT_OTHER)
Admission: RE | Admit: 2015-11-29 | Discharge: 2015-11-29 | Disposition: A | Discharge status: Home / Self Care | Payer: Medicare Other | Source: Ambulatory Visit | Attending: Orthopedic Surgery | Admitting: Orthopedic Surgery

## 2015-11-29 DIAGNOSIS — F419 Anxiety disorder, unspecified: Secondary | ICD-10-CM | POA: Diagnosis not present

## 2015-11-29 DIAGNOSIS — Z79899 Other long term (current) drug therapy: Secondary | ICD-10-CM | POA: Diagnosis not present

## 2015-11-29 DIAGNOSIS — Z6831 Body mass index (BMI) 31.0-31.9, adult: Secondary | ICD-10-CM | POA: Diagnosis not present

## 2015-11-29 DIAGNOSIS — F329 Major depressive disorder, single episode, unspecified: Secondary | ICD-10-CM | POA: Diagnosis not present

## 2015-11-29 DIAGNOSIS — E669 Obesity, unspecified: Secondary | ICD-10-CM | POA: Diagnosis not present

## 2015-11-29 DIAGNOSIS — E1043 Type 1 diabetes mellitus with diabetic autonomic (poly)neuropathy: Secondary | ICD-10-CM | POA: Diagnosis not present

## 2015-11-29 DIAGNOSIS — Z794 Long term (current) use of insulin: Secondary | ICD-10-CM | POA: Diagnosis not present

## 2015-11-29 DIAGNOSIS — H409 Unspecified glaucoma: Secondary | ICD-10-CM | POA: Diagnosis not present

## 2015-11-29 DIAGNOSIS — K3184 Gastroparesis: Secondary | ICD-10-CM | POA: Diagnosis not present

## 2015-11-29 DIAGNOSIS — E039 Hypothyroidism, unspecified: Secondary | ICD-10-CM | POA: Diagnosis not present

## 2015-11-29 DIAGNOSIS — E10319 Type 1 diabetes mellitus with unspecified diabetic retinopathy without macular edema: Secondary | ICD-10-CM | POA: Diagnosis not present

## 2015-11-29 DIAGNOSIS — K219 Gastro-esophageal reflux disease without esophagitis: Secondary | ICD-10-CM | POA: Diagnosis not present

## 2015-11-29 DIAGNOSIS — Z96652 Presence of left artificial knee joint: Secondary | ICD-10-CM | POA: Diagnosis not present

## 2015-11-29 DIAGNOSIS — M65342 Trigger finger, left ring finger: Secondary | ICD-10-CM | POA: Diagnosis present

## 2015-11-29 DIAGNOSIS — Z9641 Presence of insulin pump (external) (internal): Secondary | ICD-10-CM | POA: Diagnosis not present

## 2015-11-29 DIAGNOSIS — I10 Essential (primary) hypertension: Secondary | ICD-10-CM | POA: Diagnosis not present

## 2015-11-29 LAB — BASIC METABOLIC PANEL
ANION GAP: 5 (ref 5–15)
BUN: 19 mg/dL (ref 6–20)
CO2: 23 mmol/L (ref 22–32)
Calcium: 9.2 mg/dL (ref 8.9–10.3)
Chloride: 110 mmol/L (ref 101–111)
Creatinine, Ser: 1.57 mg/dL — ABNORMAL HIGH (ref 0.44–1.00)
GFR calc Af Amer: 38 mL/min — ABNORMAL LOW (ref >60)
GFR, EST NON AFRICAN AMERICAN: 33 mL/min — AB (ref >60)
GLUCOSE: 94 mg/dL (ref 65–99)
POTASSIUM: 5.2 mmol/L — AB (ref 3.5–5.1)
Sodium: 138 mmol/L (ref 135–145)

## 2015-12-04 ENCOUNTER — Encounter (HOSPITAL_BASED_OUTPATIENT_CLINIC_OR_DEPARTMENT_OTHER): Payer: Self-pay | Admitting: Orthopedic Surgery

## 2015-12-04 ENCOUNTER — Encounter (HOSPITAL_BASED_OUTPATIENT_CLINIC_OR_DEPARTMENT_OTHER)
Admission: RE | Disposition: A | Discharge status: Home / Self Care | Payer: Self-pay | Source: Ambulatory Visit | Attending: Orthopedic Surgery

## 2015-12-04 ENCOUNTER — Ambulatory Visit (HOSPITAL_BASED_OUTPATIENT_CLINIC_OR_DEPARTMENT_OTHER): Payer: Medicare Other | Admitting: Certified Registered"

## 2015-12-04 ENCOUNTER — Ambulatory Visit (HOSPITAL_BASED_OUTPATIENT_CLINIC_OR_DEPARTMENT_OTHER)
Admission: RE | Admit: 2015-12-04 | Discharge: 2015-12-04 | Disposition: A | Discharge status: Home / Self Care | Payer: Medicare Other | Source: Ambulatory Visit | Attending: Orthopedic Surgery | Admitting: Orthopedic Surgery

## 2015-12-04 DIAGNOSIS — E10319 Type 1 diabetes mellitus with unspecified diabetic retinopathy without macular edema: Secondary | ICD-10-CM | POA: Insufficient documentation

## 2015-12-04 DIAGNOSIS — M65342 Trigger finger, left ring finger: Secondary | ICD-10-CM | POA: Diagnosis not present

## 2015-12-04 DIAGNOSIS — I10 Essential (primary) hypertension: Secondary | ICD-10-CM | POA: Insufficient documentation

## 2015-12-04 DIAGNOSIS — Z794 Long term (current) use of insulin: Secondary | ICD-10-CM | POA: Insufficient documentation

## 2015-12-04 DIAGNOSIS — K219 Gastro-esophageal reflux disease without esophagitis: Secondary | ICD-10-CM | POA: Insufficient documentation

## 2015-12-04 DIAGNOSIS — K3184 Gastroparesis: Secondary | ICD-10-CM | POA: Diagnosis not present

## 2015-12-04 DIAGNOSIS — E1043 Type 1 diabetes mellitus with diabetic autonomic (poly)neuropathy: Secondary | ICD-10-CM | POA: Insufficient documentation

## 2015-12-04 DIAGNOSIS — F329 Major depressive disorder, single episode, unspecified: Secondary | ICD-10-CM | POA: Insufficient documentation

## 2015-12-04 DIAGNOSIS — F419 Anxiety disorder, unspecified: Secondary | ICD-10-CM | POA: Insufficient documentation

## 2015-12-04 DIAGNOSIS — E039 Hypothyroidism, unspecified: Secondary | ICD-10-CM | POA: Insufficient documentation

## 2015-12-04 DIAGNOSIS — Z79899 Other long term (current) drug therapy: Secondary | ICD-10-CM | POA: Insufficient documentation

## 2015-12-04 DIAGNOSIS — Z6831 Body mass index (BMI) 31.0-31.9, adult: Secondary | ICD-10-CM | POA: Insufficient documentation

## 2015-12-04 DIAGNOSIS — Z96652 Presence of left artificial knee joint: Secondary | ICD-10-CM | POA: Insufficient documentation

## 2015-12-04 DIAGNOSIS — H409 Unspecified glaucoma: Secondary | ICD-10-CM | POA: Insufficient documentation

## 2015-12-04 DIAGNOSIS — E669 Obesity, unspecified: Secondary | ICD-10-CM | POA: Insufficient documentation

## 2015-12-04 DIAGNOSIS — Z9641 Presence of insulin pump (external) (internal): Secondary | ICD-10-CM | POA: Insufficient documentation

## 2015-12-04 HISTORY — PX: TRIGGER FINGER RELEASE: SHX641

## 2015-12-04 HISTORY — DX: Major depressive disorder, single episode, unspecified: F32.9

## 2015-12-04 HISTORY — DX: Hypothyroidism, unspecified: E03.9

## 2015-12-04 HISTORY — DX: Depression, unspecified: F32.A

## 2015-12-04 HISTORY — DX: Gastro-esophageal reflux disease without esophagitis: K21.9

## 2015-12-04 LAB — GLUCOSE, CAPILLARY
GLUCOSE-CAPILLARY: 96 mg/dL (ref 65–99)
GLUCOSE-CAPILLARY: 46 mg/dL — AB (ref 65–99)
Glucose-Capillary: 34 mg/dL — CL (ref 65–99)
Glucose-Capillary: 70 mg/dL (ref 65–99)

## 2015-12-04 SURGERY — RELEASE, A1 PULLEY, FOR TRIGGER FINGER
Anesthesia: Monitor Anesthesia Care | Site: Finger | Laterality: Left

## 2015-12-04 MED ORDER — FENTANYL CITRATE (PF) 100 MCG/2ML IJ SOLN
50.0000 ug | INTRAMUSCULAR | Status: DC | PRN
  Administered 2015-12-04 (×2): 50 ug via INTRAVENOUS

## 2015-12-04 MED ORDER — CHLORHEXIDINE GLUCONATE 4 % EX LIQD: 60.0000 mL | Freq: Once | CUTANEOUS | Status: DC

## 2015-12-04 MED ORDER — ONDANSETRON HCL 4 MG/2ML IJ SOLN
INTRAMUSCULAR | Status: DC | PRN
  Administered 2015-12-04: 4 mg via INTRAVENOUS

## 2015-12-04 MED ORDER — CEFAZOLIN SODIUM-DEXTROSE 2-4 GM/100ML-% IV SOLN
2.0000 g | INTRAVENOUS | Status: AC
  Administered 2015-12-04: 2 g via INTRAVENOUS

## 2015-12-04 MED ORDER — BUPIVACAINE HCL (PF) 0.25 % IJ SOLN
INTRAMUSCULAR | Status: AC
  Filled 2015-12-04: qty 120

## 2015-12-04 MED ORDER — MIDAZOLAM HCL 2 MG/2ML IJ SOLN
1.0000 mg | INTRAMUSCULAR | Status: DC | PRN
  Administered 2015-12-04: 1 mg via INTRAVENOUS

## 2015-12-04 MED ORDER — LACTATED RINGERS IV SOLN
INTRAVENOUS | Status: DC
  Administered 2015-12-04: 10 mL/h via INTRAVENOUS
  Administered 2015-12-04: 09:00:00 via INTRAVENOUS

## 2015-12-04 MED ORDER — SCOPOLAMINE 1 MG/3DAYS TD PT72: 1.0000 | MEDICATED_PATCH | Freq: Once | TRANSDERMAL | Status: DC | PRN

## 2015-12-04 MED ORDER — LIDOCAINE 2% (20 MG/ML) 5 ML SYRINGE
INTRAMUSCULAR | Status: AC
  Filled 2015-12-04: qty 5

## 2015-12-04 MED ORDER — FENTANYL CITRATE (PF) 100 MCG/2ML IJ SOLN
INTRAMUSCULAR | Status: AC
  Filled 2015-12-04: qty 2

## 2015-12-04 MED ORDER — LIDOCAINE HCL (CARDIAC) 20 MG/ML IV SOLN
INTRAVENOUS | Status: DC | PRN
  Administered 2015-12-04: 20 mg via INTRAVENOUS

## 2015-12-04 MED ORDER — GLYCOPYRROLATE 0.2 MG/ML IJ SOLN: 0.2000 mg | Freq: Once | INTRAMUSCULAR | Status: DC | PRN

## 2015-12-04 MED ORDER — MIDAZOLAM HCL 2 MG/2ML IJ SOLN
INTRAMUSCULAR | Status: AC
  Filled 2015-12-04: qty 2

## 2015-12-04 MED ORDER — BUPIVACAINE HCL (PF) 0.25 % IJ SOLN
INTRAMUSCULAR | Status: DC | PRN
  Administered 2015-12-04: 4 mL

## 2015-12-04 MED ORDER — CEFAZOLIN SODIUM-DEXTROSE 2-4 GM/100ML-% IV SOLN
INTRAVENOUS | Status: AC
  Filled 2015-12-04: qty 100

## 2015-12-04 MED ORDER — PROPOFOL 500 MG/50ML IV EMUL
INTRAVENOUS | Status: DC | PRN
  Administered 2015-12-04: 100 ug/kg/min via INTRAVENOUS

## 2015-12-04 MED ORDER — LIDOCAINE HCL (PF) 0.5 % IJ SOLN
INTRAMUSCULAR | Status: DC | PRN
  Administered 2015-12-04: 50 mL via INTRAVENOUS

## 2015-12-04 MED ORDER — TRAMADOL HCL 50 MG PO TABS: 50.0000 mg | ORAL_TABLET | Freq: Four times a day (QID) | ORAL | Status: DC | PRN

## 2015-12-04 SURGICAL SUPPLY — 73 items
BAG DECANTER FOR FLEXI CONT (MISCELLANEOUS) IMPLANT
BANDAGE COBAN STERILE 2 (GAUZE/BANDAGES/DRESSINGS) ×4 IMPLANT
BLADE MINI RND TIP GREEN BEAV (BLADE) IMPLANT
BLADE SURG 15 STRL LF DISP TIS (BLADE) ×2 IMPLANT
BLADE SURG 15 STRL SS (BLADE) ×4
BNDG CMPR 9X4 STRL LF SNTH (GAUZE/BANDAGES/DRESSINGS) ×2
BNDG COHESIVE 3X5 TAN STRL LF (GAUZE/BANDAGES/DRESSINGS) ×1 IMPLANT
BNDG ESMARK 4X9 LF (GAUZE/BANDAGES/DRESSINGS) ×4 IMPLANT
BNDG GAUZE ELAST 4 BULKY (GAUZE/BANDAGES/DRESSINGS) IMPLANT
CHLORAPREP W/TINT 26ML (MISCELLANEOUS) ×4 IMPLANT
CORDS BIPOLAR (ELECTRODE) ×4 IMPLANT
COVER BACK TABLE 60X90IN (DRAPES) ×4 IMPLANT
COVER MAYO STAND STRL (DRAPES) ×4 IMPLANT
CUFF TOURNIQUET SINGLE 18IN (TOURNIQUET CUFF) ×1 IMPLANT
DECANTER SPIKE VIAL GLASS SM (MISCELLANEOUS) IMPLANT
DRAIN TLS ROUND 10FR (DRAIN) IMPLANT
DRAPE EXTREMITY T 121X128X90 (DRAPE) ×4 IMPLANT
DRAPE SURG 17X23 STRL (DRAPES) ×4 IMPLANT
DRSG PAD ABDOMINAL 8X10 ST (GAUZE/BANDAGES/DRESSINGS) IMPLANT
GAUZE SPONGE 4X4 12PLY STRL (GAUZE/BANDAGES/DRESSINGS) ×4 IMPLANT
GAUZE SPONGE 4X4 16PLY XRAY LF (GAUZE/BANDAGES/DRESSINGS) IMPLANT
GAUZE XEROFORM 1X8 LF (GAUZE/BANDAGES/DRESSINGS) ×4 IMPLANT
GLOVE BIOGEL PI IND STRL 7.0 (GLOVE) ×2 IMPLANT
GLOVE BIOGEL PI IND STRL 8.5 (GLOVE) ×2 IMPLANT
GLOVE BIOGEL PI INDICATOR 7.0 (GLOVE) ×4
GLOVE BIOGEL PI INDICATOR 8.5 (GLOVE) ×2
GLOVE ECLIPSE 6.5 STRL STRAW (GLOVE) ×3 IMPLANT
GLOVE SURG ORTHO 8.0 STRL STRW (GLOVE) ×4 IMPLANT
GOWN STRL REUS W/ TWL LRG LVL3 (GOWN DISPOSABLE) ×2 IMPLANT
GOWN STRL REUS W/TWL LRG LVL3 (GOWN DISPOSABLE) ×4
GOWN STRL REUS W/TWL XL LVL3 (GOWN DISPOSABLE) ×4 IMPLANT
LOOP VESSEL MAXI BLUE (MISCELLANEOUS) IMPLANT
NDL HYPO 25X1 1.5 SAFETY (NEEDLE) IMPLANT
NDL KEITH (NEEDLE) IMPLANT
NDL PRECISIONGLIDE 27X1.5 (NEEDLE) ×1 IMPLANT
NEEDLE HYPO 25X1 1.5 SAFETY (NEEDLE) IMPLANT
NEEDLE KEITH (NEEDLE) IMPLANT
NEEDLE PRECISIONGLIDE 27X1.5 (NEEDLE) ×4 IMPLANT
NS IRRIG 1000ML POUR BTL (IV SOLUTION) ×4 IMPLANT
PACK BASIN DAY SURGERY FS (CUSTOM PROCEDURE TRAY) ×4 IMPLANT
PAD CAST 3X4 CTTN HI CHSV (CAST SUPPLIES) ×1 IMPLANT
PAD CAST 4YDX4 CTTN HI CHSV (CAST SUPPLIES) IMPLANT
PADDING CAST ABS 3INX4YD NS (CAST SUPPLIES)
PADDING CAST ABS 4INX4YD NS (CAST SUPPLIES) ×2
PADDING CAST ABS COTTON 3X4 (CAST SUPPLIES) IMPLANT
PADDING CAST ABS COTTON 4X4 ST (CAST SUPPLIES) ×2 IMPLANT
PADDING CAST COTTON 3X4 STRL (CAST SUPPLIES)
PADDING CAST COTTON 4X4 STRL (CAST SUPPLIES)
SLEEVE SCD COMPRESS KNEE MED (MISCELLANEOUS) IMPLANT
SPLINT PLASTER CAST XFAST 3X15 (CAST SUPPLIES) IMPLANT
SPLINT PLASTER XTRA FASTSET 3X (CAST SUPPLIES)
STOCKINETTE 4X48 STRL (DRAPES) ×4 IMPLANT
SUT CHROMIC 5 0 P 3 (SUTURE) IMPLANT
SUT ETHIBOND 3-0 V-5 (SUTURE) IMPLANT
SUT ETHILON 3 0 PS 1 (SUTURE) IMPLANT
SUT ETHILON 4 0 PS 2 18 (SUTURE) ×5 IMPLANT
SUT FIBERWIRE 4-0 18 DIAM BLUE (SUTURE)
SUT MERSILENE 2.0 SH NDLE (SUTURE) IMPLANT
SUT MERSILENE 4 0 P 3 (SUTURE) IMPLANT
SUT MERSILENE 6 0 P 1 (SUTURE) IMPLANT
SUT PROLENE 2 0 SH DA (SUTURE) IMPLANT
SUT PROLENE 5 0 P 3 (SUTURE) IMPLANT
SUT SILK 4 0 PS 2 (SUTURE) IMPLANT
SUT SUPRAMID 4-0 (SUTURE) IMPLANT
SUT VIC AB 4-0 P-3 18XBRD (SUTURE) IMPLANT
SUT VIC AB 4-0 P3 18 (SUTURE)
SUT VICRYL 4-0 PS2 18IN ABS (SUTURE) IMPLANT
SUTURE FIBERWR 4-0 18 DIA BLUE (SUTURE) IMPLANT
SYR BULB 3OZ (MISCELLANEOUS) ×4 IMPLANT
SYR CONTROL 10ML LL (SYRINGE) ×4 IMPLANT
TOWEL OR 17X24 6PK STRL BLUE (TOWEL DISPOSABLE) ×8 IMPLANT
TUBE FEEDING 5FR 15 INCH (TUBING) IMPLANT
UNDERPAD 30X30 (UNDERPADS AND DIAPERS) ×1 IMPLANT

## 2015-12-04 NOTE — Anesthesia Preprocedure Evaluation (Addendum)
Anesthesia Evaluation  Patient identified by MRN, date of birth, ID band Patient awake    Reviewed: Allergy & Precautions, NPO status , Patient's Chart, lab work & pertinent test results  Airway Mallampati: II  TM Distance: >3 FB Neck ROM: Full    Dental  (+) Teeth Intact, Dental Advisory Given   Pulmonary neg pulmonary ROS,    Pulmonary exam normal breath sounds clear to auscultation       Cardiovascular hypertension, Pt. on medications Normal cardiovascular exam Rhythm:Regular Rate:Normal     Neuro/Psych PSYCHIATRIC DISORDERS Anxiety Depression negative neurological ROS     GI/Hepatic Neg liver ROS, GERD  Medicated and Controlled,Gastroparesis    Endo/Other  diabetes, Type 1, Insulin DependentHypothyroidism Obesity   Renal/GU Renal InsufficiencyRenal disease     Musculoskeletal  (+) Arthritis , Osteoarthritis,    Abdominal   Peds  Hematology negative hematology ROS (+)   Anesthesia Other Findings Day of surgery medications reviewed with the patient.  Reproductive/Obstetrics                           Anesthesia Physical Anesthesia Plan  ASA: III  Anesthesia Plan: MAC and Bier Block   Post-op Pain Management:    Induction: Intravenous  Airway Management Planned: Nasal Cannula  Additional Equipment:   Intra-op Plan:   Post-operative Plan:   Informed Consent: I have reviewed the patients History and Physical, chart, labs and discussed the procedure including the risks, benefits and alternatives for the proposed anesthesia with the patient or authorized representative who has indicated his/her understanding and acceptance.   Dental advisory given  Plan Discussed with:   Anesthesia Plan Comments: (Risks/benefits of regional block discussed with patient including risk of bleeding, infection, nerve damage, and possibility of failed block.  Also discussed backup plan of general  anesthesia and associated risks.  Patient wishes to proceed.)       Anesthesia Quick Evaluation

## 2015-12-04 NOTE — Transfer of Care (Signed)
Immediate Anesthesia Transfer of Care Note  Patient: Jasmin Simmons  Procedure(s) Performed: Procedure(s) with comments: RELEASE A-1 PULLEY LEFT RING FINGER (Left) - ANESTHESIA: IV REGIONAL UPPER ARM  Patient Location: PACU  Anesthesia Type:MAC and Bier block  Level of Consciousness: awake, alert , oriented and patient cooperative  Airway & Oxygen Therapy: Patient Spontanous Breathing and Patient connected to face mask oxygen  Post-op Assessment: Report given to RN, Post -op Vital signs reviewed and stable and Patient moving all extremities  Post vital signs: Reviewed and stable  Last Vitals:  Filed Vitals:   12/04/15 0842 12/04/15 1013  BP: 119/69 137/86  Pulse: 65 64  Temp: 36.8 C   Resp: 18     Last Pain: There were no vitals filed for this visit.       Complications: No apparent anesthesia complications

## 2015-12-04 NOTE — Op Note (Signed)
Jasmin Simmons, CERBONE                ACCOUNT NO.:  [id number]  MEDICAL RECORD NO.:  [id number]  LOCATION:                                 FACILITY:  PHYSICIAN:  Daryll Brod, M.D.       DATE OF BIRTH:  [birth year]  DATE OF PROCEDURE:  12/04/2015 DATE OF DISCHARGE:                              OPERATIVE REPORT   PREOPERATIVE DIAGNOSIS:  Recurrent trigger finger, left ring finger.  POSTOPERATIVE DIAGNOSIS:  Recurrent trigger finger, left ring finger.  OPERATIONS:  Release of A1 pulley, left ring finger.  Partial tenosynovectomy.  SURGEON:  Daryll Brod, M.D.  ASSISTANT:  None.  ANESTHESIA:  Upper arm IV regional with local infiltration.  ANESTHESIOLOGIST:  Dr. Gifford Shave.  PLACE OF SURGERY:  [facility].  HISTORY:  The patient is a 70 year old female with history of triggering of left ring finger.  She has had this released in the past elsewhere, she was admitted now with continued triggering.  She was admitted for re- release, possible excision of one limb of superficialis depending on finding.  Pre, peri and postoperative course have been discussed along with risks and complications.  She is aware that there was no guarantee with the surgery; the possibility of infection; recurrence of injury to arteries, nerves, tendons; incomplete relief of symptoms and dystrophy. In the preoperative area, the patient is seen, the extremity marked by both patient and surgeon and antibiotic given.  PROCEDURE IN DETAIL:  The patient was brought to the operating room, an upper arm IV regional anesthetic was carried out without difficulty. She was prepped using ChloraPrep, supine position, left arm free.  A 3- minute dry time was allowed.  Time-out taken, confirming the patient and procedure.  An oblique incision was made over the A1 pulley, a left ring finger carried down through the subcutaneous tissue.  Bleeders were electrocauterized with bipolar.  Significant thickening of the A1  pulley was immediately identified, scarring at the beginning of A2 was also noted.  The incision was then made on the radial aspect of A1 and partially on A2.  A partial tenosynovectomy performed proximally.  The finger placed through a full passive range of motion and no further triggering was noted.  No significant injury to the tendons was noted from the prior release.  The wound was copiously irrigated with saline and has been closed with interrupted 4-0 nylon sutures.  Local infiltration with 0.25% bupivacaine without epinephrine was given, approximately 5 mL was used.  Sterile compressive dressing with the fingers free was applied.  On deflation of the tourniquet, all fingers were immediately pinked.  She was taken to the recovery room for observation in satisfactory condition.  She will be discharged to home to return to the [facility] in 1 week, on Ultram.    ______________________________ Daryll Brod, M.D.   ______________________________ Daryll Brod, M.D.    GK/MEDQ  D:  12/04/2015  T:  12/04/2015  Job:  [id number]

## 2015-12-04 NOTE — Anesthesia Postprocedure Evaluation (Signed)
Anesthesia Post Note  Patient: Jasmin Simmons  Procedure(s) Performed: Procedure(s) (LRB): RELEASE A-1 PULLEY LEFT RING FINGER (Left)  Patient location during evaluation: PACU Anesthesia Type: MAC and Bier Block Level of consciousness: awake and alert Pain management: pain level controlled Vital Signs Assessment: post-procedure vital signs reviewed and stable Respiratory status: spontaneous breathing, nonlabored ventilation, respiratory function stable and patient connected to nasal cannula oxygen Cardiovascular status: stable and blood pressure returned to baseline Anesthetic complications: no    Last Vitals:  Filed Vitals:   12/04/15 1115 12/04/15 1150  BP: 135/57 117/75  Pulse: 66 65  Temp:  36.4 C  Resp: 15 16    Last Pain:  Filed Vitals:   12/04/15 1157  PainSc: 0-No pain                 Catalina Gravel

## 2015-12-04 NOTE — H&P (Signed)
Jasmin Simmons is an 70 y.o. female.   Chief Complaint: catching left ring finger HPI:  Jasmin Simmons is a 70 year old left hand dominant female, complaining of catching of her left ring finger. She is treated by [name]. She has brittle diabetes. She is on an insulin pump. She had this finger release by [name] in [place] in 05/2015. She states that it has recurred over the past month. She has no new injuries. She states prior to having it released by [name], she had six months of triggering. She has had multiple other digits involved with a trigger finger releases. Her thumbs have not been released. She complains of sharp pain when it occurs, 10/10. She has a history of diabetes, thyroid problems, and arthritis. There is no history of gout. These are negative in family. She has had bilateral carpal tunnel release done by Dr. [name] at the [facility]. She has apparently had a Dupuytren's excised in her index finger.  Past Medical History:  Diagnosis Date  . Arthritis  . Cataract  . Diabetes mellitus  INSULIN PUMP EPIDRA LBS 350 AM HAD A CORTIZONE INJECTION X 2 DAYS AICHG 7.1  . Diabetic retinopathy  . Glaucoma  . Hypertension  . IDDM (insulin dependent diabetes mellitus) ([place])  INSULIN PUMP LBS 129 AM AICHG 7.3   Past Surgical History:  Procedure Laterality Date  . CATARACT EXTRACTION Bilateral  . PARS PLANA VITRECTOMY Bilateral  laser  . TOTAL KNEE ARTHROPLASTY Left 04/2014   Past Medical History  Diagnosis Date  . Fibroid   . Hypertension   . Thyroid disease     hypothyroidism  . Anxiety   . Anemia   . Gastroparesis     & rectal dysfunction  . PTSD (post-traumatic stress disorder)     secondary to eye problems  . Cancer [facility])     breast cancer, stage 0  (radiation)  . Diabetes mellitus without complication (HCC)     Type I  . Hypothyroidism   . Depression   . GERD (gastroesophageal reflux disease)     Past Surgical History  Procedure Laterality Date  .  Endometrial biopsy    . Breast biopsy Right 8/98    stereotactic core bx, epith hyperplasia  . Cesarean section    . Endometrial ablation    . Carpal tunnel release      bilateral  . Rotator cuff repair Right   . Trigger finger release Bilateral   . Joint replacement Left     TKR  . Cataract extraction    . Pars plana vitrectomy      History reviewed. No pertinent family history. Social History:  reports that she has never smoked. She does not have any smokeless tobacco history on file. She reports that she does not use illicit drugs. Her alcohol history is not on file.  Allergies:  Allergies  Allergen Reactions  . Tobramycin-Dexamethasone     Other reaction(s): Other (See Comments) No description of reaction noted  . Codeine     No prescriptions prior to admission    No results found for this or any previous visit (from the past 48 hour(s)).  No results found.   Pertinent items are noted in HPI.  Height 5\' 7"  (1.702 m), weight 90.266 kg (199 lb).  General appearance: alert, cooperative and appears stated age Head: Normocephalic, without obvious abnormality Neck: no JVD Resp: clear to auscultation bilaterally Cardio: regular rate and rhythm, S1, S2 normal, no murmur, click, rub  or gallop GI: soft, non-tender; bowel sounds normal; no masses,  no organomegaly Extremities: catching right ring finger Pulses: 2+ and symmetric Skin: Skin color, texture, turgor normal. No rashes or lesions Neurologic: Grossly normal Incision/Wound: na  Assessment/Plan  Assessment:  1. Trigger ring finger of left hand    Plan: PLAN: We have discussed with her the release of the right ring finger. This may require a partial resection of the superficialis tendon depending on whether triggering can be resolved. This may require extended Bruner incision. We have recommended this rather than injections, which she is in agreement with. She does not want to have injections. She states that  the sugars are way up following injections. She is aware that there is no guarantee with the surgery, the possibility of infection, recurrence, injury to arteries, nerves, tendons, complete relief of symptoms, dystrophy. This will be scheduled as an outpatient under regional anesthesia. Questions are encouraged and answered to her satisfaction.   [name] 12/04/2015, 4:27 AM

## 2015-12-04 NOTE — Brief Op Note (Signed)
12/04/2015  10:01 AM  PATIENT:  Jasmin Simmons  70 y.o. female  PRE-OPERATIVE DIAGNOSIS:  RECURRENT TRIGGER LEFT RING FINGER  POST-OPERATIVE DIAGNOSIS:  RECURRENT TRIGGER LEFT RING FINGER  PROCEDURE:  Procedure(s) with comments: RELEASE A-1 PULLEY LEFT RING FINGER (Left) - ANESTHESIA: IV REGIONAL UPPER ARM  SURGEON:  Surgeon(s) and Role:    * Daryll Brod, MD - Primary  PHYSICIAN ASSISTANT:   ASSISTANTS: none   ANESTHESIA:   local and regional  EBL:  Total I/O In: 200 [I.V.:200] Out: 1 [Blood:1]  BLOOD ADMINISTERED:none  DRAINS: none   LOCAL MEDICATIONS USED:  BUPIVICAINE   SPECIMEN:  No Specimen  DISPOSITION OF SPECIMEN:  NA  COUNTS:  YES  TOURNIQUET:  * Missing tourniquet times found for documented tourniquets in log:  [id number] *  DICTATION: .Other Dictation: Dictation Number [id number]  PLAN OF CARE: Discharge to home after PACU  PATIENT DISPOSITION:  PACU - hemodynamically stable.

## 2015-12-04 NOTE — Op Note (Signed)
Dictation Number [id number]

## 2015-12-04 NOTE — Anesthesia Procedure Notes (Signed)
Procedure Name: MAC Date/Time: 12/04/2015 9:33 AM Performed by: Baxter Flattery Pre-anesthesia Checklist: Patient identified, Emergency Drugs available, Suction available and Patient being monitored Patient Re-evaluated:Patient Re-evaluated prior to inductionOxygen Delivery Method: Simple face mask Preoxygenation: Pre-oxygenation with 100% oxygen Intubation Type: IV induction Ventilation: Mask ventilation without difficulty Dental Injury: Teeth and Oropharynx as per pre-operative assessment

## 2015-12-04 NOTE — Discharge Instructions (Addendum)

## 2015-12-06 ENCOUNTER — Encounter (HOSPITAL_BASED_OUTPATIENT_CLINIC_OR_DEPARTMENT_OTHER): Payer: Self-pay | Admitting: Orthopedic Surgery

## 2016-08-12 ENCOUNTER — Other Ambulatory Visit: Payer: Self-pay

## 2016-08-12 DIAGNOSIS — R5381 Other malaise: Secondary | ICD-10-CM

## 2016-10-29 ENCOUNTER — Other Ambulatory Visit: Payer: Self-pay | Admitting: Internal Medicine

## 2016-10-29 DIAGNOSIS — Z9889 Other specified postprocedural states: Secondary | ICD-10-CM

## 2016-10-29 DIAGNOSIS — Z853 Personal history of malignant neoplasm of breast: Secondary | ICD-10-CM

## 2016-10-29 DIAGNOSIS — C50911 Malignant neoplasm of unspecified site of right female breast: Secondary | ICD-10-CM

## 2016-11-03 ENCOUNTER — Other Ambulatory Visit: Payer: Medicare Other

## 2016-11-11 ENCOUNTER — Ambulatory Visit
Admission: RE | Admit: 2016-11-11 | Discharge: 2016-11-11 | Disposition: A | Discharge status: Home / Self Care | Payer: Medicare Other | Source: Ambulatory Visit | Attending: Internal Medicine | Admitting: Internal Medicine

## 2016-11-11 DIAGNOSIS — C50911 Malignant neoplasm of unspecified site of right female breast: Secondary | ICD-10-CM

## 2016-11-11 DIAGNOSIS — Z9889 Other specified postprocedural states: Secondary | ICD-10-CM

## 2016-11-11 DIAGNOSIS — Z853 Personal history of malignant neoplasm of breast: Secondary | ICD-10-CM

## 2016-12-02 ENCOUNTER — Telehealth: Payer: Self-pay | Admitting: Hematology

## 2016-12-02 ENCOUNTER — Encounter: Payer: Self-pay | Admitting: Hematology

## 2016-12-02 NOTE — Telephone Encounter (Signed)
Appt has been scheduled for the pt to see Dr. Burr Medico on 7/30 at 11am. Pt agreed to the appt date and time. Demographics verified. Letter mailed to the pt and faxed to the referring.

## 2016-12-12 ENCOUNTER — Emergency Department (HOSPITAL_COMMUNITY): Payer: Medicare Other

## 2016-12-12 ENCOUNTER — Emergency Department (HOSPITAL_COMMUNITY)
Admission: EM | Admit: 2016-12-12 | Discharge: 2016-12-12 | Disposition: A | Discharge status: Home / Self Care | Payer: Medicare Other | Attending: Emergency Medicine | Admitting: Emergency Medicine

## 2016-12-12 ENCOUNTER — Encounter (HOSPITAL_COMMUNITY): Payer: Self-pay

## 2016-12-12 DIAGNOSIS — Z853 Personal history of malignant neoplasm of breast: Secondary | ICD-10-CM | POA: Diagnosis not present

## 2016-12-12 DIAGNOSIS — I10 Essential (primary) hypertension: Secondary | ICD-10-CM | POA: Diagnosis not present

## 2016-12-12 DIAGNOSIS — E039 Hypothyroidism, unspecified: Secondary | ICD-10-CM | POA: Insufficient documentation

## 2016-12-12 DIAGNOSIS — E109 Type 1 diabetes mellitus without complications: Secondary | ICD-10-CM | POA: Insufficient documentation

## 2016-12-12 DIAGNOSIS — R002 Palpitations: Secondary | ICD-10-CM | POA: Diagnosis present

## 2016-12-12 DIAGNOSIS — Z885 Allergy status to narcotic agent status: Secondary | ICD-10-CM | POA: Diagnosis not present

## 2016-12-12 DIAGNOSIS — R51 Headache: Secondary | ICD-10-CM | POA: Diagnosis not present

## 2016-12-12 LAB — COMPREHENSIVE METABOLIC PANEL
ALBUMIN: 3.7 g/dL (ref 3.5–5.0)
ALT: 22 U/L (ref 14–54)
AST: 31 U/L (ref 15–41)
Alkaline Phosphatase: 126 U/L (ref 38–126)
Anion gap: 8 (ref 5–15)
BILIRUBIN TOTAL: 0.3 mg/dL (ref 0.3–1.2)
BUN: 28 mg/dL — AB (ref 6–20)
CHLORIDE: 107 mmol/L (ref 101–111)
CO2: 22 mmol/L (ref 22–32)
Calcium: 8.8 mg/dL — ABNORMAL LOW (ref 8.9–10.3)
Creatinine, Ser: 1.66 mg/dL — ABNORMAL HIGH (ref 0.44–1.00)
GFR calc Af Amer: 35 mL/min — ABNORMAL LOW (ref >60)
GFR calc non Af Amer: 30 mL/min — ABNORMAL LOW (ref >60)
GLUCOSE: 267 mg/dL — AB (ref 65–99)
POTASSIUM: 4.8 mmol/L (ref 3.5–5.1)
SODIUM: 137 mmol/L (ref 135–145)
Total Protein: 6.4 g/dL — ABNORMAL LOW (ref 6.5–8.1)

## 2016-12-12 LAB — CBC WITH DIFFERENTIAL/PLATELET
BASOS ABS: 0 10*3/uL (ref 0.0–0.1)
BASOS PCT: 0 %
EOS ABS: 0.6 10*3/uL (ref 0.0–0.7)
Eosinophils Relative: 8 %
HEMATOCRIT: 38.2 % (ref 36.0–46.0)
Hemoglobin: 12 g/dL (ref 12.0–15.0)
Lymphocytes Relative: 24 %
Lymphs Abs: 1.7 10*3/uL (ref 0.7–4.0)
MCH: 29.9 pg (ref 26.0–34.0)
MCHC: 31.4 g/dL (ref 30.0–36.0)
MCV: 95 fL (ref 78.0–100.0)
MONO ABS: 0.4 10*3/uL (ref 0.1–1.0)
Monocytes Relative: 5 %
NEUTROS ABS: 4.5 10*3/uL (ref 1.7–7.7)
Neutrophils Relative %: 63 %
PLATELETS: 270 10*3/uL (ref 150–400)
RBC: 4.02 MIL/uL (ref 3.87–5.11)
RDW: 13.1 % (ref 11.5–15.5)
WBC: 7.2 10*3/uL (ref 4.0–10.5)

## 2016-12-12 LAB — I-STAT TROPONIN, ED: TROPONIN I, POC: 0 ng/mL (ref 0.00–0.08)

## 2016-12-12 LAB — TSH: TSH: 0.029 u[IU]/mL — ABNORMAL LOW (ref 0.350–4.500)

## 2016-12-12 LAB — T4, FREE: Free T4: 1.34 ng/dL — ABNORMAL HIGH (ref 0.61–1.12)

## 2016-12-12 MED ORDER — SODIUM CHLORIDE 0.9 % IV BOLUS (SEPSIS)
1000.0000 mL | Freq: Once | INTRAVENOUS | Status: AC
  Administered 2016-12-12: 1000 mL via INTRAVENOUS

## 2016-12-12 MED ORDER — METOCLOPRAMIDE HCL 5 MG/ML IJ SOLN
10.0000 mg | Freq: Once | INTRAMUSCULAR | Status: AC
  Administered 2016-12-12: 10 mg via INTRAVENOUS
  Filled 2016-12-12: qty 2

## 2016-12-12 MED ORDER — DIPHENHYDRAMINE HCL 50 MG/ML IJ SOLN
12.5000 mg | Freq: Once | INTRAMUSCULAR | Status: AC
  Administered 2016-12-12: 12.5 mg via INTRAVENOUS
  Filled 2016-12-12: qty 1

## 2016-12-12 NOTE — Discharge Instructions (Signed)
Please read attached information regarding your condition. Follow-up with PCP for further evaluation and medication adjustments as warranted. Continue home medications as previously prescribed. Return to ED for worsening chest pain, shortness of breath, injury, lightheadedness, loss of consciousness.

## 2016-12-12 NOTE — ED Notes (Signed)
pts headache is better  Pt drowsy  Blanket given

## 2016-12-12 NOTE — ED Notes (Signed)
Pt sleeping. 

## 2016-12-12 NOTE — ED Triage Notes (Signed)
Pt presents with 2 day h/o palpitations followed by sudden onset of R temporal headache.  +diaphoresis and vomiting;  Pt denies any chest pain or shortness of breath.

## 2016-12-12 NOTE — ED Notes (Signed)
Pt just returned from xray x2

## 2016-12-12 NOTE — ED Provider Notes (Signed)
[facility] DEPT Provider Note   CSN: [id number] Arrival date & time: 12/12/16  1412     History   Chief Complaint Chief Complaint  Patient presents with  . Palpitations    HPI Jasmin Simmons is a 71 y.o. female.  HPI  Patient, with a past medical history of type 1 diabetes, hypertension, hyperlipidemia, breast cancer, hypothyroidism presents to ED for 2 day history of intermittent palpitations. She also reports right-sided temporal headache yesterday and one episode of vomiting immediately afterwards. She also reports diaphoresis after waking up from a nap yesterday. She states ibuprofen has helped with the headache and now it feels like "just a migraine." She denies any chest pain, shortness of breath, hemoptysis, nausea, vomiting, abdominal pain, scalp tenderness, head injury, loss of consciousness, vision changes. She does report some constipation due to her history of SIBO (small intestine bacterial overgrowth).  Past Medical History:  Diagnosis Date  . Anemia   . Anxiety   . Cancer [facility])    breast cancer, stage 0  (radiation)  . Depression   . Diabetes mellitus without complication (HCC)    Type I  . Fibroid   . Gastroparesis    & rectal dysfunction  . GERD (gastroesophageal reflux disease)   . Hypertension   . Hypothyroidism   . PTSD (post-traumatic stress disorder)    secondary to eye problems  . Thyroid disease    hypothyroidism    Patient Active Problem List   Diagnosis Date Noted  . Pain of left hand 11/14/2015  . Degenerative arthritis of finger 11/14/2015  . Triggering of digit 11/14/2015  . 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 Proliferative diabetic retinopathy ([place]) 05/14/2015  . Type 2 diabetes mellitus ([place]) 10/16/2014  . Brow ptosis 01/16/2014  . Dermatochalasis of eyelid 01/16/2014  . Pseudoaphakia 04/14/2013  . Primary open angle glaucoma 02/10/2013    Past Surgical History:  Procedure Laterality Date  . BREAST BIOPSY Right 8/98   stereotactic core bx, epith hyperplasia  . CARPAL TUNNEL RELEASE     bilateral  . CATARACT EXTRACTION    . CESAREAN SECTION    . ENDOMETRIAL ABLATION    . ENDOMETRIAL BIOPSY    . JOINT REPLACEMENT Left    TKR  . PARS PLANA VITRECTOMY    . ROTATOR CUFF REPAIR Right   . TRIGGER FINGER RELEASE Bilateral   . TRIGGER FINGER RELEASE Left 12/04/2015   Procedure: RELEASE A-1 PULLEY LEFT RING FINGER;  Surgeon: Daryll Brod, MD;  Location: [facility];  Service: Orthopedics;  Laterality: Left;  ANESTHESIA: IV REGIONAL UPPER ARM    OB History    Gravida Para Term Preterm AB Living   1 1 1     1    SAB TAB Ectopic Multiple Live Births                   Home Medications    Prior to Admission medications   Medication Sig Start Date End Date Taking? Authorizing Provider  atorvastatin (LIPITOR) 40 MG tablet Take 40 mg by mouth once a day 08/28/15  Yes [provider]  buPROPion (WELLBUTRIN SR) 200 MG 12 hr tablet Take 200 mg by mouth 2 (two) times daily.   Yes [provider]  Cyanocobalamin (B-12 PO) Take 1 tablet by mouth daily with breakfast.   Yes [provider]  diazepam (VALIUM) 5 MG tablet Take 5 mg by mouth 2 (two) times daily.    Yes [provider]  hyoscyamine (  LEVSIN SL) 0.125 MG SL tablet Place 0.125 mg under the tongue every 6 (six) hours as needed for cramping.  12/11/15  Yes [provider]  insulin glulisine (APIDRA) 100 UNIT/ML injection See admin instructions. Average total of 35 units/day PER INSULIN PUMP   Yes [provider]  KRISTALOSE 20 g packet Dissolve the contents of 1 packet into 4 ounces of water and drink once a day 09/28/15  Yes [provider]  latanoprost (XALATAN) 0.005 % ophthalmic solution Place 1 drop into both eyes at bedtime. 07/23/14  Yes [provider]  levothyroxine (SYNTHROID, LEVOTHROID) 100 MCG tablet Take 100 mcg by mouth daily before breakfast.   Yes [provider]  naproxen sodium (ALEVE) 220 MG tablet Take 220-440 mg by mouth 2 (two) times daily as needed (for pain).   Yes [provider]  ondansetron (ZOFRAN-ODT) 8 MG disintegrating tablet Take 8 mg by mouth every 8 (eight) hours as needed. 12/11/15  Yes [provider]  pantoprazole (PROTONIX) 40 MG tablet Take 40 mg by mouth daily.   Yes [provider]  Polyethyl Glycol-Propyl Glycol (SYSTANE PRESERVATIVE FREE OP) Place 1-2 drops into both eyes 2 (two) times daily as needed (for irritation).    Yes [provider]  ramipril (ALTACE) 10 MG capsule Take 10 mg by mouth once a day 11/06/15  Yes [provider]  temazepam (RESTORIL) 15 MG capsule Take 15 mg by mouth at bedtime.   Yes [provider]  LINZESS 290 MCG CAPS capsule  11/05/15   [provider]  traMADol (ULTRAM) 50 MG tablet Take 1 tablet (50 mg total) by mouth every 6 (six) hours as needed. Patient not taking: Reported on 12/12/2016 12/04/15   Daryll Brod, MD    Family History History reviewed. No pertinent family history.  Social History Social History  Substance Use Topics  . Smoking status: Never Smoker  . Smokeless tobacco: Never Used  . Alcohol use Not on file     Comment: social     Allergies   Tobramycin-dexamethasone; Ciprofloxacin; and Codeine   Review of Systems Review of Systems  Constitutional: Negative for appetite change, chills and fever.  HENT: Negative for ear pain, rhinorrhea, sneezing and sore throat.   Eyes: Negative for photophobia and visual disturbance.  Respiratory: Negative for cough, chest tightness, shortness of breath and wheezing.   Cardiovascular: Positive for palpitations. Negative for chest pain and leg swelling.  Gastrointestinal: Positive for constipation and vomiting. Negative for abdominal pain, blood in stool, diarrhea and nausea.  Genitourinary: Negative for dysuria, hematuria and urgency.  Musculoskeletal: Negative for  myalgias.  Skin: Negative for rash.  Neurological: Positive for headaches. Negative for dizziness, syncope, weakness and light-headedness.     Physical Exam Updated Vital Signs BP (!) 159/56   Pulse 72   Temp 99 F (37.2 C) (Oral)   Resp 11   Ht 5\' 5"  (1.651 m)   Wt 80.7 kg (178 lb)   SpO2 100%   BMI 29.62 kg/m   Physical Exam  Constitutional: She is oriented to person, place, and time. She appears well-developed and well-nourished. No distress.  HENT:  Head: Normocephalic and atraumatic.  Nose: Nose normal.  Eyes: Conjunctivae and EOM are normal. Left eye exhibits no discharge. No scleral icterus.  Neck: Normal range of motion. Neck supple.  Cardiovascular: Normal rate, regular rhythm, normal heart sounds and intact distal pulses.  Exam reveals no gallop and no friction rub.   No murmur  heard. Pulmonary/Chest: Effort normal and breath sounds normal. No respiratory distress.  Abdominal: Soft. Bowel sounds are normal. She exhibits no distension. There is no tenderness. There is no guarding.  Musculoskeletal: Normal range of motion. She exhibits no edema.  Neurological: She is alert and oriented to person, place, and time. No cranial nerve deficit or sensory deficit. She exhibits normal muscle tone. Coordination normal.  Pupils reactive. No facial asymmetry noted. Cranial nerves appear grossly intact. Sensation intact to light touch on face, BUE and BLE. Strength 5/5 in BUE and BLE. Normal patellar reflexes bilaterally.   Skin: Skin is warm and dry. No rash noted.  Psychiatric: She has a normal mood and affect.  Nursing note and vitals reviewed.    ED Treatments / Results  Labs (all labs ordered are listed, but only abnormal results are displayed) Labs Reviewed  COMPREHENSIVE METABOLIC PANEL - Abnormal; Notable for the following:       Result Value   Glucose, Bld 267 (*)    BUN 28 (*)    Creatinine, Ser 1.66 (*)    Calcium 8.8 (*)    Total Protein 6.4 (*)    GFR calc  non Af Amer 30 (*)    GFR calc Af Amer 35 (*)    All other components within normal limits  TSH - Abnormal; Notable for the following:    TSH 0.029 (*)    All other components within normal limits  CBC WITH DIFFERENTIAL/PLATELET  T4, FREE  I-STAT TROPONIN, ED    EKG  EKG Interpretation  Date/Time:  Friday December 12 2016 14:21:47 EDT Ventricular Rate:  71 PR Interval:  170 QRS Duration: 108 QT Interval:  398 QTC Calculation: 432 R Axis:   -14 Text Interpretation:  Normal sinus rhythm Normal ECG No significant change since last tracing Confirmed by Wandra Arthurs [phone]) on 12/12/2016 2:35:08 PM       Radiology Dg Chest 2 View  Result Date: 12/12/2016 CLINICAL DATA:  Shortness of breath. Chest and arm pain. Headache. Diaphoresis. EXAM: CHEST  2 VIEW COMPARISON:  01/19/2008 FINDINGS: Heart size is normal. Aortic atherosclerosis. The vascularity is normal. The lungs are clear. No effusions. Ordinary degenerative changes affect the spine. IMPRESSION: No active cardiopulmonary disease. Electronically Signed   By: Nelson Chimes M.D.   On: 12/12/2016 15:24   Ct Head Wo Contrast  Result Date: 12/12/2016 CLINICAL DATA:  Headache, right temporal region, with vomiting. Hypertension. EXAM: CT HEAD WITHOUT CONTRAST TECHNIQUE: Contiguous axial images were obtained from the base of the skull through the vertex without intravenous contrast. COMPARISON:  Brain MRI July 27, 2014 FINDINGS: Brain: There is mild diffuse atrophy. There is no intracranial mass, hemorrhage, extra-axial fluid collection, or midline shift. There is slight small vessel disease in the centra semiovale bilaterally. Elsewhere gray-white compartments appear normal. No evident acute infarct. Vascular: No hyperdense vessel. There is calcification in each carotid siphon region. There is also mild calcification in the distal left vertebral artery. Skull: The bony calvarium appears intact. Sinuses/Orbits: There is slight mucosal thickening in  several ethmoid air cells bilaterally. Visualized paranasal sinuses otherwise are clear. Orbits appear symmetric bilaterally. Other: Mastoid air cells are clear. IMPRESSION: Mild atrophy with mild periventricular small vessel disease. No intracranial mass, hemorrhage, or extra-axial fluid collection. No acute infarct evident. There are foci of arterial vascular calcification. There is mucosal thickening in several ethmoid air cells. Electronically Signed   By: Lowella Grip III M.D.   On: 12/12/2016 15:10  Procedures Procedures (including critical care time)  Medications Ordered in ED Medications  sodium chloride 0.9 % bolus 1,000 mL (1,000 mLs Intravenous New Bag/Given 12/12/16 1631)  metoCLOPramide (REGLAN) injection 10 mg (10 mg Intravenous Given 12/12/16 1636)  diphenhydrAMINE (BENADRYL) injection 12.5 mg (12.5 mg Intravenous Given 12/12/16 1636)     Initial Impression / Assessment and Plan / ED Course  I have reviewed the triage vital signs and the nursing notes.  Pertinent labs & imaging results that were available during my care of the patient were reviewed by me and considered in my medical decision making (see chart for details).     Patient presents to ED for evaluation of palpitations for the past 2 days as well as right-sided headache since yesterday. She reports improvement of the headache with ibuprofen. She has a past medical history of hypothyroid for which she takes 100 g of Synthroid daily. She reports compliance with this. She denies any chest pain, shortness of breath, hemoptysis, prior MI, DVT, PE or heart failure. On physical exam she has no neuro deficits. Cranial nerves appear intact with no sensory deficit noted. She is alert and oriented 4. She is afebrile with no history of fever. Satting at 100% on room air with normal heart rate. She has no scalp tenderness present that would concern me for temporal arteritis. Low suspicion for subarachnoid hemorrhage, subdural  hematoma or infectious process being the cause of her headache. She has no neck pain or neck stiffness. No rash or other meningeal signs noted. CT of the head was negative for acute process. CBC, troponin negative. CMP showed elevation of BUN/creatinine similar to baseline. TSH low at 0.029. I informed patient that this could be the cause of her palpitations. I offered medication adjustments for her but she states that she will follow-up with her PCP for medication adjustments. She states that she will schedule an appointment with her PCP this week. Patient reports feeling much better with complete resolution of headache symptoms with fluids and Reglan and Benadryl. Patient appears stable for discharge at this time. Strict return precautions given.  Patient discussed with and seen by Dr. Darl Householder.  Final Clinical Impressions(s) / ED Diagnoses   Final diagnoses:  Palpitations    New Prescriptions New Prescriptions   No medications on file     Delia Heady, Hershal Coria 12/12/16 2018    Drenda Freeze, MD 12/13/16 [phone]

## 2016-12-12 NOTE — ED Notes (Signed)
Headache  Iv started pain medicine given  Headache since last pm

## 2016-12-22 ENCOUNTER — Ambulatory Visit: Payer: Medicare Other | Admitting: Hematology

## 2017-01-14 NOTE — Progress Notes (Signed)
[place]  Telephone:([phone]) [phone] Fax:(336) [phone]  Clinic New Consult Note   Patient Care Team: Tisovec, Fransico Him, MD as PCP - General (Internal Medicine) 01/16/2017  CHIEF COMPLAINTS/PURPOSE OF CONSULTATION:  History of right breast DCIS  HISTORY OF PRESENTING ILLNESS:  Jasmin Simmons 71 y.o. female is here because of her h/o breast cancer. She presents today with her husband. Initially, pt had routine mammogram performed in 2011 which revealed a 84mm cluster of right breast calcifications which were concerning for malignancy. Following this she had a biopsy performed which was positive for ductal carcinoma of the right breast. She had a lumpectomy performed by Dr Ninfa Linden following this with clear margins and they were confident that they excised her cancer completely. 2/2 lymph nodes were biopsied at that time which were benign. She underwent prophylactic radiation following this which she she tolerated well. Pt recently moved back to the area and is here to establish care. She has otherwise been doing well since then and has had bi-yearly mammograms to ensure no recurrence of her disease which have all been normal, her last mammogram was performed in June which was normal that time.   Overall she has been doing well and without complaint. She does note some flare-ups occasionally of her SIBO which will cause some nausea, constipation, and diarrhea. She is able to manage this at home well and she is currently followed by [facility] for ongoing management. Additionally, she notes that she has a long standing h/o depression which is related to her chronic diseases, most significantly her diabetes. She is currently undergoing therapy for this and is managed by a psychiatrist at [facility] which she is happy with.   CURRENT THERAPY: Surveillance   MEDICAL HISTORY:  Past Medical History:  Diagnosis Date  . Anemia   . Anxiety   . Cancer [facility])    breast cancer, stage 0   (radiation)  . Depression   . Diabetes mellitus without complication (HCC)    Type I  . Fibroid   . Gastroparesis    & rectal dysfunction  . GERD (gastroesophageal reflux disease)   . Hypertension   . Hypothyroidism   . PTSD (post-traumatic stress disorder)    secondary to eye problems  . Thyroid disease    hypothyroidism    SURGICAL HISTORY: Past Surgical History:  Procedure Laterality Date  . BREAST BIOPSY Right 8/98   stereotactic core bx, epith hyperplasia  . CARPAL TUNNEL RELEASE     bilateral  . CATARACT EXTRACTION    . CESAREAN SECTION    . ENDOMETRIAL ABLATION    . ENDOMETRIAL BIOPSY    . JOINT REPLACEMENT Left    TKR  . PARS PLANA VITRECTOMY    . ROTATOR CUFF REPAIR Right   . TRIGGER FINGER RELEASE Bilateral   . TRIGGER FINGER RELEASE Left 12/04/2015   Procedure: RELEASE A-1 PULLEY LEFT RING FINGER;  Surgeon: Daryll Brod, MD;  Location: [place];  Service: Orthopedics;  Laterality: Left;  ANESTHESIA: IV REGIONAL UPPER ARM    SOCIAL HISTORY: Social History   Social History  . Marital status: Married    Spouse name: N/A  . Number of children: N/A  . Years of education: N/A   Occupational History  . Not on file.   Social History Main Topics  . Smoking status: Former Smoker    Years: 15.00    Quit date: 05/26/1996  . Smokeless tobacco: Never Used  . Alcohol use Not on  file     Comment: social  . Drug use: No  . Sexual activity: Not on file   Other Topics Concern  . Not on file   Social History Narrative  . No narrative on file    FAMILY HISTORY: Family History  Problem Relation Age of Onset  . Cancer Mother 2       uterian cancer     ALLERGIES:  is allergic to tobramycin-dexamethasone; ciprofloxacin; and codeine.  MEDICATIONS:  Current Outpatient Prescriptions  Medication Sig Dispense Refill  . atorvastatin (LIPITOR) 40 MG tablet Take 40 mg by mouth once a day  1  . buPROPion (WELLBUTRIN SR) 200 MG 12 hr tablet Take 200  mg by mouth 2 (two) times daily. 300mg  am & 200 mg pm    . calcium carbonate (OS-CAL - DOSED IN MG OF ELEMENTAL CALCIUM) 1250 (500 Ca) MG tablet Take 1 tablet by mouth at bedtime.    . cholecalciferol (VITAMIN D) 1000 units tablet Take 2,000 Units by mouth daily.    . Cyanocobalamin (B-12 PO) Take 1 tablet by mouth daily with breakfast.    . diazepam (VALIUM) 5 MG tablet Take 5 mg by mouth at bedtime.     . docusate sodium (COLACE) 100 MG capsule Take 200 mg by mouth daily.    . hyoscyamine (LEVSIN SL) 0.125 MG SL tablet Place 0.125 mg under the tongue every 6 (six) hours as needed for cramping.     . insulin glulisine (APIDRA) 100 UNIT/ML injection See admin instructions. Average total of 35 units/day PER INSULIN PUMP    . KRISTALOSE 20 g packet Dissolve the contents of 1 packet into 4 ounces of water and drink once a day prn  3  . latanoprost (XALATAN) 0.005 % ophthalmic solution Place 1 drop into both eyes at bedtime.    Marland Kitchen levothyroxine (SYNTHROID, LEVOTHROID) 100 MCG tablet Take 100 mcg by mouth daily before breakfast.    . LINZESS 290 MCG CAPS capsule Take 290 mcg by mouth as needed.   3  . ondansetron (ZOFRAN-ODT) 8 MG disintegrating tablet Take 8 mg by mouth every 8 (eight) hours as needed.    . pantoprazole (PROTONIX) 40 MG tablet Take 40 mg by mouth 2 (two) times daily.     . ramipril (ALTACE) 10 MG capsule Take 10 mg by mouth once a day  2  . temazepam (RESTORIL) 15 MG capsule Take 15 mg by mouth at bedtime.    . naproxen sodium (ALEVE) 220 MG tablet Take 220-440 mg by mouth 2 (two) times daily as needed (for pain).    Vladimir Faster Glycol-Propyl Glycol (SYSTANE PRESERVATIVE FREE OP) Place 1-2 drops into both eyes 2 (two) times daily as needed (for irritation).      No current facility-administered medications for this visit.     REVIEW OF SYSTEMS:   Constitutional: Denies fevers, chills or abnormal night sweats Eyes: Denies blurriness of vision, double vision or watery eyes Ears,  nose, mouth, throat, and face: Denies mucositis or sore throat Respiratory: Denies cough, dyspnea or wheezes Cardiovascular: Denies palpitation, chest discomfort or lower extremity swelling Gastrointestinal:  Denies nausea, heartburn or change in bowel habits Skin: Denies abnormal skin rashes Lymphatics: Denies new lymphadenopathy or easy bruising Neurological:Denies numbness, tingling or new weaknesses Behavioral/Psych: Mood is stable, no new changes  All other systems were reviewed with the patient and are negative.  PHYSICAL EXAMINATION:  ECOG PERFORMANCE STATUS: 1 - Symptomatic but completely ambulatory  Vitals:   01/16/17  1129  BP: (!) 140/46  Pulse: 70  Resp: 19  Temp: 98.3 F (36.8 C)  SpO2: 100%   Filed Weights   01/16/17 1129  Weight: 181 lb (82.1 kg)    GENERAL:alert, no distress and comfortable SKIN: skin color, texture, turgor are normal, no rashes or significant lesions EYES: normal, conjunctiva are pink and non-injected, sclera clear OROPHARYNX:no exudate, no erythema and lips, buccal mucosa, and tongue normal  NECK: supple, thyroid normal size, non-tender, without nodularity LYMPH:  no palpable lymphadenopathy in the cervical, axillary or inguinal LUNGS: clear to auscultation and percussion with normal breathing effort HEART: regular rate & rhythm and no murmurs and no lower extremity edema ABDOMEN:abdomen soft, non-tender and normal bowel sounds. Insulin pump in place.  Musculoskeletal:no cyanosis of digits and no clubbing  PSYCH: alert & oriented x 3 with fluent speech NEURO: no focal motor/sensory deficits Breast: Surgical scar in right axilla and above right breast which she states is from her prior seroma surgery. No palpable mass or adenopathy.   LABORATORY DATA:  I have reviewed the data as listed CBC Latest Ref Rng & Units 12/12/2016 08/09/2009  WBC 4.0 - 10.5 K/uL 7.2 -  Hemoglobin 12.0 - 15.0 g/dL 12.0 14.1  Hematocrit 36.0 - 46.0 % 38.2 -    Platelets 150 - 400 K/uL 270 -   CMP Latest Ref Rng & Units 12/12/2016 11/29/2015 08/08/2009  Glucose 65 - 99 mg/dL 267(H) 94 228(H)  BUN 6 - 20 mg/dL 28(H) 19 21  Creatinine 0.44 - 1.00 mg/dL 1.66(H) 1.57(H) 1.14  Sodium 135 - 145 mmol/L 137 138 134(L)  Potassium 3.5 - 5.1 mmol/L 4.8 5.2(H) 4.9  Chloride 101 - 111 mmol/L 107 110 105  CO2 22 - 32 mmol/L 22 23 24   Calcium 8.9 - 10.3 mg/dL 8.8(L) 9.2 8.8  Total Protein 6.5 - 8.1 g/dL 6.4(L) - -  Total Bilirubin 0.3 - 1.2 mg/dL 0.3 - -  Alkaline Phos 38 - 126 U/L 126 - -  AST 15 - 41 U/L 31 - -  ALT 14 - 54 U/L 22 - -    RADIOGRAPHIC STUDIES: I have personally reviewed the radiological images as listed and agreed with the findings in the report.  MM Diag Breast Tomo B/l 11/11/16 IMPRESSION: No mammographic evidence for malignancy.  MM Breast B/l w wo contrast  IMPRESSION  1.  Post biopsy changes in the central right breast with no residual malignancy identified. 2.  No evidence of malignancy elsewhere in either breast.  MM [place] R 02/18/20111 IMPRESSION: 4 mm cluster of right breast calcifications, suspicious for the possibility of malignancy.  Biopsy is recommended.  The options of stereotactic guided core needle biopsy and surgical excisional biopsy were discussed with the patient.  Stereotactic guided core needle biopsy will be performed later today.   BI-RADS CATEGORY 4:  Suspicious abnormality - biopsy should be considered.  PATHOLOGY REPORTS:   Surgical Pathology 08/09/2009 Diagnosis 1. BREAST, LUMPECTOMY, RIGHT : BIOPSY SITE REACTION AND FIBROCYSTIC CHANGES.NO RESIDUAL DUCTAL CARCINOMA IDENTIFIED.MARGINS CLEAR. 2. LYMPH NODE, SENTINEL, BIOPSY, RIGHT : ONE BENIGN LYMPH NODE. 3. LYMPH NODE, SENTINEL, BIOPSY, RIGHT : ONE BENIGN LYMPH NODE. MICROSCOPIC DESCRIPTION 1. In the localized area there is fibrosis and inflammation consistent with biopsy site reaction.The surrounding breast tissue shows  fibrocystic changes including duct ectasia, columnar cell alteration, focal adenosis and focal usual ductal hyperplasia without atypia.No residual ductal carcinoma in situ is identified and no invasive carcinoma is identified.Immunohistochemistry for calponin, p63, and  smooth muscle myosin is performed and the areas of concern show positive basal cell staining which supports a benign diagnosis.This would correspond to a ptis, pn0, pmx.(Jdp:Gt, 08/13/09).  Surgical Pathology 07/13/2009 Diagnosis  1. BREAST, RIGHT, NEEDLE CORE BIOPSY, CENTRAL : DUCTAL CARCINOMA IN SITU WITH ASSOCIATED CALCIFICATIONS AND EXTRAVASATED STROMAL MUCIN SUSPICIOUS FOR INVASIVE MUCINOUS COMPONENT. Comments Prognostic indicators - acis Interpretation: This in-situ tumor is positive for estrogen receptor and positive for progesterone receptor expression. Results Immunohistochemical and morphometric analysis by the automated cellular imaging system (acis) Estrogen receptor (negative, <1%): 99%, positive Progesterone receptor (negative, <1%): 99%, positive  ASSESSMENT & PLAN:  71 y.o. postmenopausal woman, presented with screening discovered right breast DCIS in 2011.  1. Right breast DCIS, ER+/PR+ -Previously underwent lumpectomy by Dr Ninfa Linden and prophylactic radiation in 2011, has been doing well since.  -Her DCIS was cured by complete surgical resection. Any form of adjuvant therapy is preventive. -She is at high risk for breast cancer in the future. -We discussed the chemoprevention with tamoxifen or anastrozole for DCIS. Given her history of disease beginning in 2011 and without evidence of recurrence, I do not recommend her to begin antiestrogen therapies.  -She has been very compliant with breast cancer screening, her last mammogram was unremarkable in June 2018. She will continue once a year. -We discussed breast cancer surveillance, Including annual mammogram, breast exam every 6-12  months. -I encouraged her to have healthy diet, and exercise regularly.  2. Type 1 Diabetes, HTN, SIBO She has been maintaining her sugars well at home. She does have an insulin pump and is on insulin injections.  -She reports that due to her diabetes she was diagnosed with SIBO at [facility], which will intermittently cause her some nausea and constipation. She was placed on a no fiber diet, which has caused her to lose approximately 20lbs.  -She is currently on nutritional supplements in conjunction with her no fiber dieat.  -She has f/u scheduled with GI and Dr Alyson Ingles office every three months.   3. Depression  -Mostly related to her chronic health issues.  -She is currently followed by a psychiatrist at [facility] for this with good response to her treatment.    Plan -Mammogram next June.  -F/u in 1 year. No lab   Orders Placed This Encounter  Procedures  . MM Digital Screening    Standing Status:   Future    Standing Expiration Date:   01/16/2018    Order Specific Question:   Reason for Exam (SYMPTOM  OR DIAGNOSIS REQUIRED)    Answer:   screening    Order Specific Question:   Preferred imaging location?    Answer:   [facility]    All questions were answered. The patient knows to call the clinic with any problems, questions or concerns.  I spent 30 minutes counseling the patient face to face. The total time spent in the appointment was 40 minutes and more than 50% was on counseling.     Truitt Merle, MD 01/16/2017 6:24 PM

## 2017-01-16 ENCOUNTER — Ambulatory Visit (HOSPITAL_BASED_OUTPATIENT_CLINIC_OR_DEPARTMENT_OTHER): Payer: Medicare Other | Admitting: Hematology

## 2017-01-16 ENCOUNTER — Encounter: Payer: Self-pay | Admitting: Hematology

## 2017-01-16 DIAGNOSIS — D0511 Intraductal carcinoma in situ of right breast: Secondary | ICD-10-CM

## 2017-01-16 DIAGNOSIS — Z86000 Personal history of in-situ neoplasm of breast: Secondary | ICD-10-CM

## 2017-01-29 ENCOUNTER — Other Ambulatory Visit (HOSPITAL_COMMUNITY): Payer: Self-pay | Admitting: Internal Medicine

## 2017-01-29 ENCOUNTER — Ambulatory Visit (HOSPITAL_COMMUNITY)
Admission: RE | Admit: 2017-01-29 | Discharge: 2017-01-29 | Disposition: A | Discharge status: Home / Self Care | Payer: Medicare Other | Source: Ambulatory Visit | Attending: Vascular Surgery | Admitting: Vascular Surgery

## 2017-01-29 DIAGNOSIS — M7989 Other specified soft tissue disorders: Secondary | ICD-10-CM | POA: Diagnosis not present

## 2017-06-05 ENCOUNTER — Other Ambulatory Visit (HOSPITAL_COMMUNITY)
Admission: RE | Admit: 2017-06-05 | Discharge: 2017-06-05 | Disposition: A | Discharge status: Home / Self Care | Payer: Medicare Other | Source: Ambulatory Visit | Attending: Obstetrics & Gynecology | Admitting: Obstetrics & Gynecology

## 2017-06-05 ENCOUNTER — Ambulatory Visit (INDEPENDENT_AMBULATORY_CARE_PROVIDER_SITE_OTHER): Payer: Medicare Other | Admitting: Obstetrics & Gynecology

## 2017-06-05 ENCOUNTER — Other Ambulatory Visit: Payer: Self-pay

## 2017-06-05 ENCOUNTER — Encounter: Payer: Self-pay | Admitting: Obstetrics & Gynecology

## 2017-06-05 VITALS — BP 138/60 | HR 76 | Resp 16 | Ht 65.25 in | Wt 172.0 lb

## 2017-06-05 DIAGNOSIS — Z124 Encounter for screening for malignant neoplasm of cervix: Secondary | ICD-10-CM | POA: Insufficient documentation

## 2017-06-05 DIAGNOSIS — N289 Disorder of kidney and ureter, unspecified: Secondary | ICD-10-CM | POA: Diagnosis not present

## 2017-06-05 DIAGNOSIS — E2839 Other primary ovarian failure: Secondary | ICD-10-CM

## 2017-06-05 DIAGNOSIS — Z01419 Encounter for gynecological examination (general) (routine) without abnormal findings: Secondary | ICD-10-CM | POA: Diagnosis not present

## 2017-06-05 DIAGNOSIS — E109 Type 1 diabetes mellitus without complications: Secondary | ICD-10-CM | POA: Insufficient documentation

## 2017-06-05 LAB — POCT URINALYSIS DIPSTICK
BILIRUBIN UA: NEGATIVE
Blood, UA: NEGATIVE
GLUCOSE UA: 50
KETONES UA: NEGATIVE
Nitrite, UA: NEGATIVE
Protein, UA: NEGATIVE
UROBILINOGEN UA: 0.2 U/dL
pH, UA: 5 (ref 5.0–8.0)

## 2017-06-05 MED ORDER — TRIMETHOPRIM 100 MG PO TABS: 100.0000 mg | ORAL_TABLET | Freq: Every day | ORAL | 12 refills | Status: DC

## 2017-06-05 NOTE — Patient Instructions (Addendum)
Make sure you've had a Hep C test done.  Check with Dr. Osborne Casco.  [facility] [address] [place], [place]: [phone]

## 2017-06-05 NOTE — Addendum Note (Signed)
Addended by: Megan Salon on: 06/05/2017 05:06 PM   Modules accepted: Orders

## 2017-06-05 NOTE — Addendum Note (Signed)
Addended by: Polly Cobia on: 06/05/2017 11:42 AM   Modules accepted: Orders

## 2017-06-05 NOTE — Progress Notes (Signed)
72 y.o. G1P1001 MarriedCaucasianF here for annual exam.  Has moved back to [place].  Husband was CRNA at [employer].  He has Parkinson's and going to [facility] now.  Disease is under much better control.    Denies vaginal bleeding.    Chronic constipation with GI issues.  Issues are related to long term diabetes.    HbA1C are in the six range until most recent level which was 7.2.  Has new pump and getting use to it.    PCP:  Dr. Osborne Casco  Patient's last menstrual period was 07/25/1994.          Sexually active: Yes.    The current method of family planning is post menopausal status.   Exercising: Yes.    Walking Smoker:  no  Health Maintenance: Pap:  ~2017 Normal   History of abnormal Pap:  no MMG:  11/11/16 BIRADS2:Benign.  Did 3D mammogram. Colonoscopy:  2017 f/u 5 years (with endoscopy) BMD:   2008 Osteopenia.  Was supposed to have TDaP:  Current with PCP Pneumonia vaccine(s):  Done  Shingrix:  Did 1  Hep C testing: unsure Screening Labs: PCP   reports that she quit smoking about 21 years ago. She quit after 15.00 years of use. she has never used smokeless tobacco. She reports that she does not drink alcohol or use drugs.  Past Medical History:  Diagnosis Date  . Anemia   . Anxiety   . Cancer [facility])    breast cancer, stage 0  (radiation)  . Depression   . Diabetes mellitus without complication (HCC)    Type I  . Fibroid   . Gastroparesis    & rectal dysfunction  . GERD (gastroesophageal reflux disease)   . Hypertension   . Hypothyroidism   . PTSD (post-traumatic stress disorder)    secondary to eye problems  . Small intestinal bacterial overgrowth   . Thyroid disease    hypothyroidism    Past Surgical History:  Procedure Laterality Date  . BREAST BIOPSY Right 8/98   stereotactic core bx, epith hyperplasia  . CARPAL TUNNEL RELEASE     bilateral  . CATARACT EXTRACTION    . CESAREAN SECTION    . ENDOMETRIAL ABLATION    . ENDOMETRIAL BIOPSY    . JOINT  REPLACEMENT Left    TKR  . PARS PLANA VITRECTOMY    . ROTATOR CUFF REPAIR Right   . TRIGGER FINGER RELEASE Bilateral   . TRIGGER FINGER RELEASE Left 12/04/2015   Procedure: RELEASE A-1 PULLEY LEFT RING FINGER;  Surgeon: Daryll Brod, MD;  Location: [place];  Service: Orthopedics;  Laterality: Left;  ANESTHESIA: IV REGIONAL UPPER ARM    Current Outpatient Medications  Medication Sig Dispense Refill  . atorvastatin (LIPITOR) 40 MG tablet Take 40 mg by mouth once a day  1  . buPROPion (WELLBUTRIN SR) 200 MG 12 hr tablet Take 200 mg by mouth 2 (two) times daily. 300mg  am & 200 mg pm    . cholecalciferol (VITAMIN D) 1000 units tablet Take 2,000 Units by mouth daily.    . Cyanocobalamin (B-12 PO) Take 1 tablet by mouth daily with breakfast.    . diazepam (VALIUM) 5 MG tablet Take 5 mg by mouth at bedtime.     . docusate sodium (COLACE) 100 MG capsule Take 200 mg by mouth daily.    . hyoscyamine (LEVSIN SL) 0.125 MG SL tablet Place 0.125 mg under the tongue every 6 (six) hours as needed for  cramping.     . insulin glulisine (APIDRA) 100 UNIT/ML injection See admin instructions. Average total of 35 units/day PER INSULIN PUMP    . KRISTALOSE 20 g packet Dissolve the contents of 1 packet into 4 ounces of water and drink once a day prn  3  . latanoprost (XALATAN) 0.005 % ophthalmic solution at bedtime.    Marland Kitchen levothyroxine (SYNTHROID, LEVOTHROID) 100 MCG tablet Take 100 mcg by mouth daily before breakfast.    . LINZESS 290 MCG CAPS capsule Take 290 mcg by mouth as needed.   3  . ondansetron (ZOFRAN-ODT) 8 MG disintegrating tablet Take 8 mg by mouth every 8 (eight) hours as needed.    . pantoprazole (PROTONIX) 40 MG tablet Take 40 mg by mouth 2 (two) times daily.     Vladimir Faster Glycol-Propyl Glycol (SYSTANE PRESERVATIVE FREE OP) Place 1-2 drops into both eyes 2 (two) times daily as needed (for irritation).     . Propylene Glycol (SYSTANE COMPLETE OP) Apply to eye.    . ramipril (ALTACE)  10 MG capsule Take 10 mg by mouth once a day  2  . temazepam (RESTORIL) 15 MG capsule Take 15 mg by mouth at bedtime.     No current facility-administered medications for this visit.     Family History  Problem Relation Age of Onset  . Cancer Mother 64       uterian cancer     ROS:  Pertinent items are noted in HPI.  Otherwise, a comprehensive ROS was negative.  Exam:   BP 138/60 (BP Location: Right Arm, Patient Position: Sitting, Cuff Size: Normal)   Pulse 76   Resp 16   Ht 5' 5.25" (1.657 m)   Wt 172 lb (78 kg)   LMP 07/25/1994   BMI 28.40 kg/m    Height: 5' 5.25" (165.7 cm)  Ht Readings from Last 3 Encounters:  06/05/17 5' 5.25" (1.657 m)  01/16/17 5\' 5"  (1.651 m)  12/12/16 5\' 5"  (1.651 m)    General appearance: alert, cooperative and appears stated age Head: Normocephalic, without obvious abnormality, atraumatic Neck: no adenopathy, supple, symmetrical, trachea midline and thyroid normal to inspection and palpation Lungs: clear to auscultation bilaterally Breasts: normal appearance, no masses or tenderness Heart: regular rate and rhythm Abdomen: soft, non-tender; bowel sounds normal; no masses,  no organomegaly Extremities: extremities normal, atraumatic, no cyanosis or edema Skin: Skin color, texture, turgor normal. No rashes or lesions Lymph nodes: Cervical, supraclavicular, and axillary nodes normal. No abnormal inguinal nodes palpated Neurologic: Grossly normal   Pelvic: External genitalia:  no lesions              Urethra:  normal appearing urethra with no masses, tenderness or lesions              Bartholins and Skenes: normal                 Vagina: normal appearing vagina with normal color and discharge, no lesions              Cervix: no lesions              Pap taken: Yes.   Bimanual Exam:  Uterus:  normal size, contour, position, consistency, mobility, non-tender              Adnexa: normal adnexa and no mass, fullness, tenderness                Rectovaginal: Confirms  Anus:  normal sphincter tone, no lesions  Chaperone was present for exam.  A:  Well Woman with normal exam PMP, no HRT H/o DCIS Type 1 diabetes Gastroparesis  Renal insufficiency hypothyroidism   P:   Mammogram guidelines reviewed.  Doing yearly. Pap smear Blood work done with Dr. Osborne Casco Has one more shingrix vaccine to do Will place order for DEXA scan Return annually or prn

## 2017-06-06 LAB — URINE CULTURE

## 2017-06-10 LAB — CYTOLOGY - PAP: Diagnosis: NEGATIVE

## 2017-06-12 ENCOUNTER — Ambulatory Visit: Payer: Medicare Other | Admitting: Podiatry

## 2017-06-29 ENCOUNTER — Telehealth: Payer: Self-pay | Admitting: Obstetrics & Gynecology

## 2017-06-29 NOTE — Telephone Encounter (Signed)
Patient called and left a message on our answering machine during lunch. She said, "I've had a mix-up with my antibiotics and I'd like to speak with the nurse."

## 2017-06-29 NOTE — Telephone Encounter (Signed)
Spoke with patient and spouse. Patient requesting to change trimethoprim to macrobid. Started on 06/05/17, symptoms not improved. Patient states she was advised to call if not working.   Reports urinary frequency and painful urination.   Has taken AZO q8 hrs for 2 days for symptom relief with no changes.   Denies N/V, fever/chills, lower back pain, blood in urine.   Advised patient will review with Dr. Sabra Heck and return call with recommendations, patient is agreeable.   06/05/17 -urine culture negative  Dr. Sabra Heck  -please advise on antibiotic change?

## 2017-06-29 NOTE — Telephone Encounter (Signed)
Reviewed with Dr. Sabra Heck -repeat urine culture recommended, nurse visit. If negative, will evaluate other cause of symptoms.  Call returned to patient, advised per Dr. Sabra Heck. Nurse visit scheduled for 06/30/17 at 3pm. Patient verbalizes understanding and is agreeable.   Routing to provider for final review. Patient is agreeable to disposition. Will close encounter.

## 2017-06-30 ENCOUNTER — Ambulatory Visit (INDEPENDENT_AMBULATORY_CARE_PROVIDER_SITE_OTHER): Payer: Medicare Other

## 2017-06-30 ENCOUNTER — Other Ambulatory Visit: Payer: Self-pay

## 2017-06-30 VITALS — BP 132/72 | HR 70 | Resp 16 | Ht 67.0 in | Wt 172.0 lb

## 2017-06-30 DIAGNOSIS — R3915 Urgency of urination: Secondary | ICD-10-CM

## 2017-06-30 NOTE — Progress Notes (Signed)
Patient in office for repeat urine culture. Patient states that she still has some urgency to urinate and abdominal cramping. Clean-catch urine obtained and sent to the lab to be resulted.   Routing to provider for final review. Patient agreeable to disposition. Will close encounter.

## 2017-07-01 ENCOUNTER — Telehealth: Payer: Self-pay | Admitting: Obstetrics & Gynecology

## 2017-07-01 MED ORDER — SULFAMETHOXAZOLE-TRIMETHOPRIM 800-160 MG PO TABS: 1.0000 | ORAL_TABLET | Freq: Two times a day (BID) | ORAL | 0 refills | Status: DC

## 2017-07-01 NOTE — Telephone Encounter (Signed)
Patient calling to find out how long will it be before she receive urine results from yesterday.

## 2017-07-01 NOTE — Telephone Encounter (Signed)
Spoke with patient's husband [name]. Okay per ROI. Husband would like to know when patient's urine culture will return. Advised this can take up to 72 hours. Advised she will be contacted as soon as they return. Asking if an antibiotic can be called in in the interim. Advised will need to await urine culture to ensure proper antibiotic is prescribed. Husband verbalizes understanding.  Reviewed with Dr.Miller who recommends treating the patient with Bactrim BS bid x 5 days #10 0RF sent to pharmacy on file. Husband has been notified and is agreeable.  Routing to provider for final review.

## 2017-07-02 ENCOUNTER — Telehealth: Payer: Self-pay | Admitting: Obstetrics & Gynecology

## 2017-07-02 ENCOUNTER — Other Ambulatory Visit: Payer: Self-pay | Admitting: Obstetrics & Gynecology

## 2017-07-02 LAB — URINE CULTURE

## 2017-07-02 MED ORDER — NITROFURANTOIN MONOHYD MACRO 100 MG PO CAPS: 100.0000 mg | ORAL_CAPSULE | Freq: Two times a day (BID) | ORAL | 0 refills | Status: DC

## 2017-07-02 MED ORDER — NITROFURANTOIN MONOHYD MACRO 100 MG PO CAPS
100.0000 mg | ORAL_CAPSULE | Freq: Two times a day (BID) | ORAL | 0 refills | Status: DC
Start: 2017-07-02 — End: 2017-07-02

## 2017-07-02 NOTE — Telephone Encounter (Signed)
Saw law result on pt.  Called her personally and advised that e coli was present but resistent to Bactrim.  Rx for macrobid 100mg  bid x 5 days sent to [facility] [address]--pharmacy of choice.  Please have her come back for TOC in 1 week.  Thanks.

## 2017-07-02 NOTE — Telephone Encounter (Signed)
Patient called to see if her recent urinalysis results are ready yet.

## 2017-07-02 NOTE — Telephone Encounter (Signed)
Patient's final urine culture returned positive for E coli. Patient was given Bactrim DS. Routing to [place] for review.

## 2017-07-03 NOTE — Telephone Encounter (Signed)
Spoke with patient's husband [name], okay per ROI. Nurse appointment for [facility] scheduled for 07/09/2017 at 2 pm. Encounter closed.

## 2017-07-09 ENCOUNTER — Ambulatory Visit (INDEPENDENT_AMBULATORY_CARE_PROVIDER_SITE_OTHER): Payer: Medicare Other

## 2017-07-09 VITALS — BP 110/62 | HR 70 | Wt 164.6 lb

## 2017-07-09 DIAGNOSIS — N390 Urinary tract infection, site not specified: Secondary | ICD-10-CM

## 2017-07-09 NOTE — Progress Notes (Signed)
Patient here today for TOC urine culture and states she is feeling better.

## 2017-07-11 LAB — URINE CULTURE

## 2017-07-13 ENCOUNTER — Telehealth: Payer: Self-pay | Admitting: *Deleted

## 2017-07-13 NOTE — Telephone Encounter (Signed)
Patient returned call. Patient states symptoms have resolved, but states at her last appointment, Dr. Sabra Heck called in trimethoprim and that does not work as well for her as macrobid. Patient asking if Dr. Sabra Heck would be okay switching her back to macrobid? RN advised would review with Dr. Sabra Heck and return call. Patient agreeable. Pharmacy on file verified.   Routing to provider for review.

## 2017-07-13 NOTE — Telephone Encounter (Signed)
Spoke with patient's husband, Gerald Stabs, okay per DPR. Bronson Curb of negative UC results. Will have patient call to give update of symptoms when she returns home. Advised she could ask to speak to [place].

## 2017-07-13 NOTE — Telephone Encounter (Signed)
-----   Message from Megan Salon, MD sent at 07/12/2017 11:31 PM EST ----- Please let pt know the repeat urine culture was negative.  Please make sure symptoms have resolved.  Thanks.

## 2017-07-15 NOTE — Telephone Encounter (Signed)
Ok to switch back to macrobid 100mg  daily.  Needs #30/12 RF.

## 2017-07-16 MED ORDER — NITROFURANTOIN MONOHYD MACRO 100 MG PO CAPS: 100.0000 mg | ORAL_CAPSULE | Freq: Every day | ORAL | 12 refills | Status: DC

## 2017-07-16 NOTE — Telephone Encounter (Signed)
Call to patient. Message given to patient as seen below from Dr. Sabra Heck. Patient verbalized understanding. Macrobid 100mg , #30, 12RF sent to [facility] on [address] per patient preference.   Routing to provider for final review. Patient agreeable to disposition. Will close encounter.

## 2017-08-10 ENCOUNTER — Ambulatory Visit
Admission: RE | Admit: 2017-08-10 | Discharge: 2017-08-10 | Disposition: A | Discharge status: Home / Self Care | Payer: Medicare Other | Source: Ambulatory Visit | Attending: Obstetrics & Gynecology | Admitting: Obstetrics & Gynecology

## 2017-08-10 DIAGNOSIS — E2839 Other primary ovarian failure: Secondary | ICD-10-CM

## 2017-09-09 ENCOUNTER — Other Ambulatory Visit: Payer: Self-pay

## 2017-09-09 DIAGNOSIS — I739 Peripheral vascular disease, unspecified: Secondary | ICD-10-CM

## 2017-09-25 ENCOUNTER — Ambulatory Visit (HOSPITAL_COMMUNITY)
Admission: RE | Admit: 2017-09-25 | Discharge: 2017-09-25 | Disposition: A | Discharge status: Home / Self Care | Payer: Medicare Other | Source: Ambulatory Visit | Attending: Vascular Surgery | Admitting: Vascular Surgery

## 2017-09-25 DIAGNOSIS — I739 Peripheral vascular disease, unspecified: Secondary | ICD-10-CM | POA: Insufficient documentation

## 2017-09-30 ENCOUNTER — Other Ambulatory Visit: Payer: Self-pay

## 2017-09-30 ENCOUNTER — Encounter: Payer: Self-pay | Admitting: Surgery

## 2017-09-30 ENCOUNTER — Ambulatory Visit: Payer: Medicare Other | Admitting: Surgery

## 2017-09-30 VITALS — BP 152/75 | HR 75 | Temp 97.7°F | Resp 16 | Ht 66.0 in | Wt 167.0 lb

## 2017-09-30 DIAGNOSIS — I70213 Atherosclerosis of native arteries of extremities with intermittent claudication, bilateral legs: Secondary | ICD-10-CM

## 2017-09-30 NOTE — Progress Notes (Signed)
Vitals:   09/30/17 1429  BP: (!) 143/78  Pulse: 74  Resp: 16  Temp: 97.7 F (36.5 C)  TempSrc: Oral  SpO2: 99%  Weight: 167 lb (75.8 kg)  Height: 5\' 6"  (1.676 m)

## 2017-09-30 NOTE — Progress Notes (Signed)
[facility] [place]  Patient name: Jasmin Simmons MRN: [id number] DOB: 1946/05/21 Sex: female   REQUESTING PROVIDER:    Dr. Osborne Casco   REASON FOR CONSULT:    claudication  HISTORY OF PRESENT ILLNESS:   Jasmin Simmons is a 72 y.o. female, who is referred today for evaluation of arterial insufficiency.  She states that approximately 6 months ago she developed right leg pain on the inside of her leg.  She thought she had a cluster of veins there.  She had a venous ultrasound which was unremarkable.  She has been having cramping in both calves which is been going on for 2 years.  Exercise tends to make them worse however when they occur while she is laying down walking or riding a bike or what alleviate her symptoms.  She is also noticed that the tips of her toes have become slightly cooler.  The patient is medically managed for diabetes.  She has had type 1 diabetes for 35 years.  Her most recent A1c was 7.9 however she attributes this to difficulty with a new pump.  Usually she states it is less than 7.  She is medically managed for hypertension. She is on a statin for hypercholesterolemia.  She has a history of tobacco abuse.  She has stage 3 renal insufficiency.  Her uncle died of a stroke.  She has several family members with diabetes as well as cardiac disease.  PAST MEDICAL HISTORY    Past Medical History:  Diagnosis Date  . Anemia   . Anxiety   . Cancer [facility])    breast cancer, stage 0  (radiation)  . Depression   . Diabetes mellitus without complication (HCC)    Type I  . Fibroid   . Gastroparesis    & rectal dysfunction  . GERD (gastroesophageal reflux disease)   . Hypertension   . Hypothyroidism   . PTSD (post-traumatic stress disorder)    secondary to eye problems  . Small intestinal bacterial overgrowth   . Thyroid disease    hypothyroidism     FAMILY HISTORY   Family History  Problem Relation Age of  Onset  . Cancer Mother 78       uterian cancer     SOCIAL HISTORY:   Social History   Socioeconomic History  . Marital status: Married    Spouse name: Not on file  . Number of children: Not on file  . Years of education: Not on file  . Highest education level: Not on file  Occupational History  . Not on file  Social Needs  . Financial resource strain: Not on file  . Food insecurity:    Worry: Not on file    Inability: Not on file  . Transportation needs:    Medical: Not on file    Non-medical: Not on file  Tobacco Use  . Smoking status: Former Smoker    Years: 15.00    Last attempt to quit: 05/26/1996    Years since quitting: 21.3  . Smokeless tobacco: Never Used  Substance and Sexual Activity  . Alcohol use: No    Alcohol/week: 0.0 oz    Frequency: Never    Comment: social  . Drug use: No  . Sexual activity: Yes    Birth control/protection: Post-menopausal  Lifestyle  . Physical activity:    Days per week: Not on file    Minutes per session: Not on file  . Stress: Not on file  Relationships  . Social connections:    Talks on phone: Not on file    Gets together: Not on file    Attends religious service: Not on file    Active member of club or organization: Not on file    Attends meetings of clubs or organizations: Not on file    Relationship status: Not on file  . Intimate partner violence:    Fear of current or ex partner: Not on file    Emotionally abused: Not on file    Physically abused: Not on file    Forced sexual activity: Not on file  Other Topics Concern  . Not on file  Social History Narrative  . Not on file    ALLERGIES:    Allergies  Allergen Reactions  . Tobramycin-Dexamethasone Swelling and Other (See Comments)    "Eye turned inside/out," when this was used post-operatively  . Ciprofloxacin Nausea And Vomiting  . Codeine Nausea And Vomiting    CURRENT MEDICATIONS:    Current Outpatient Medications  Medication Sig Dispense  Refill  . atorvastatin (LIPITOR) 40 MG tablet Take 40 mg by mouth once a day  1  . buPROPion (WELLBUTRIN SR) 200 MG 12 hr tablet Take 200 mg by mouth 2 (two) times daily. 300mg  am & 200 mg pm    . cholecalciferol (VITAMIN D) 1000 units tablet Take 2,000 Units by mouth daily.    . Cyanocobalamin (B-12 PO) Take 1 tablet by mouth daily with breakfast.    . diazepam (VALIUM) 5 MG tablet Take 5 mg by mouth at bedtime.     . docusate sodium (COLACE) 100 MG capsule Take 200 mg by mouth daily.    . hyoscyamine (LEVSIN SL) 0.125 MG SL tablet Place 0.125 mg under the tongue every 6 (six) hours as needed for cramping.     . insulin glulisine (APIDRA) 100 UNIT/ML injection See admin instructions. Average total of 35 units/day PER INSULIN PUMP    . KRISTALOSE 20 g packet Dissolve the contents of 1 packet into 4 ounces of water and drink once a day prn  3  . latanoprost (XALATAN) 0.005 % ophthalmic solution at bedtime.    Marland Kitchen levothyroxine (SYNTHROID, LEVOTHROID) 100 MCG tablet Take 100 mcg by mouth daily before breakfast.    . LINZESS 290 MCG CAPS capsule Take 290 mcg by mouth as needed.   3  . nitrofurantoin, macrocrystal-monohydrate, (MACROBID) 100 MG capsule Take 1 capsule (100 mg total) by mouth daily. 30 capsule 12  . ondansetron (ZOFRAN-ODT) 8 MG disintegrating tablet Take 8 mg by mouth every 8 (eight) hours as needed.    . pantoprazole (PROTONIX) 40 MG tablet Take 40 mg by mouth 2 (two) times daily.     Vladimir Faster Glycol-Propyl Glycol (SYSTANE PRESERVATIVE FREE OP) Place 1-2 drops into both eyes 2 (two) times daily as needed (for irritation).     . Propylene Glycol (SYSTANE COMPLETE OP) Apply to eye.    . ramipril (ALTACE) 10 MG capsule Take 10 mg by mouth once a day  2  . temazepam (RESTORIL) 15 MG capsule Take 15 mg by mouth at bedtime.    Marland Kitchen trimethoprim (TRIMPEX) 100 MG tablet Take 1 tablet (100 mg total) by mouth daily. 30 tablet 12   No current facility-administered medications for this visit.       REVIEW OF SYSTEMS:   [X]  denotes positive finding, [ ]  denotes negative finding Cardiac  Comments:  Chest pain or chest pressure: x  Shortness of breath upon exertion:    Short of breath when lying flat:    Irregular heart rhythm:        Vascular    Pain in calf, thigh, or hip brought on by ambulation: x   Pain in feet at night that wakes you up from your sleep:     Blood clot in your veins:    Leg swelling:  x       Pulmonary    Oxygen at home:    Productive cough:     Wheezing:         Neurologic    Sudden weakness in arms or legs:     Sudden numbness in arms or legs:     Sudden onset of difficulty speaking or slurred speech:    Temporary loss of vision in one eye:     Problems with dizziness:  x       Gastrointestinal    Blood in stool:      Vomited blood:         Genitourinary    Burning when urinating:     Blood in urine:        Psychiatric    Major depression:         Hematologic    Bleeding problems:    Problems with blood clotting too easily:        Skin    Rashes or ulcers:        Constitutional    Fever or chills:     PHYSICAL EXAM:   There were no vitals filed for this visit.  GENERAL: The patient is a well-nourished female, in no acute distress. The vital signs are documented above. CARDIAC: There is a regular rate and rhythm.  VASCULAR: No carotid bruits.  Palpable dorsalis pedis pulses bilaterally. PULMONARY: Nonlabored respirations ABDOMEN: Soft and non-tender.  No pulsatile mass MUSCULOSKELETAL: There are no major deformities or cyanosis. NEUROLOGIC: No focal weakness or paresthesias are detected. SKIN: There are no ulcers or rashes noted. PSYCHIATRIC: The patient has a normal affect.  STUDIES:   I have reviewed her vascular lab studies: ABI/TBIToday's ABIToday's TBIPrevious ABIPrevious TBI +-------+-----------+-----------+------------+------------+ Right 1.17    0.74                  +-------+-----------+-----------+------------+------------+ Left  1.02    0.73                 +-------+-----------+-----------+------------+------------+  ASSESSMENT and PLAN   PAD: The patient has normal ABIs and toe pressures as well as palpable pedal pulses.  I do not feel that she has significant lower extremity vascular insufficiency at this time.  She does have a family history of carotid artery disease.  She does not have a bruit on physical exam.  I have encouraged her to take advantage of a life screening event to get a carotid duplex.  We also talked about formal cardiology evaluation given her family history as well as her personal long-standing history of diabetes.   Annamarie Major, MD [facility] [phone] Pager [phone]

## 2017-10-14 ENCOUNTER — Encounter: Payer: Medicare Other | Admitting: Vascular Surgery

## 2017-10-14 ENCOUNTER — Encounter (HOSPITAL_COMMUNITY): Payer: Medicare Other

## 2017-10-27 ENCOUNTER — Other Ambulatory Visit: Payer: Self-pay | Admitting: Internal Medicine

## 2017-10-27 DIAGNOSIS — Z1231 Encounter for screening mammogram for malignant neoplasm of breast: Secondary | ICD-10-CM

## 2017-11-16 ENCOUNTER — Ambulatory Visit: Payer: Medicare Other

## 2017-11-27 DIAGNOSIS — N184 Chronic kidney disease, stage 4 (severe): Secondary | ICD-10-CM | POA: Insufficient documentation

## 2017-12-29 ENCOUNTER — Ambulatory Visit
Admission: RE | Admit: 2017-12-29 | Discharge: 2017-12-29 | Disposition: A | Discharge status: Home / Self Care | Payer: Medicare Other | Source: Ambulatory Visit | Attending: Internal Medicine | Admitting: Internal Medicine

## 2017-12-29 DIAGNOSIS — Z1231 Encounter for screening mammogram for malignant neoplasm of breast: Secondary | ICD-10-CM

## 2018-01-14 NOTE — Progress Notes (Signed)
[place]  [phone] [phone]  Clinic Follow Up Note   Patient Care Team: Tisovec, Fransico Him, MD as PCP - General (Internal Medicine)   Date of Service:  01/15/2018  CHIEF COMPLAINTS:  F/u for History of right breast DCIS  HISTORY OF PRESENTING ILLNESS:  Jasmin Simmons 72 y.o. female is here because of her h/o breast cancer. She presents today with her husband. Initially, pt had routine mammogram performed in 2011 which revealed a 53mm cluster of right breast calcifications which were concerning for malignancy. Following this she had a biopsy performed which was positive for ductal carcinoma of the right breast. She had a lumpectomy performed by Dr Ninfa Linden following this with clear margins and they were confident that they excised her cancer completely. 2/2 lymph nodes were biopsied at that time which were benign. She underwent prophylactic radiation following this which she tolerated well. Pt recently moved back to the area and is here to establish care. She has otherwise been doing well since then and has had bi-yearly mammograms to ensure no recurrence of her disease which have all been normal, her last mammogram was performed in June which was normal that time.   Overall she has been doing well and without complaint. She does note some flare-ups occasionally of her SIBO which will cause some nausea, constipation, and diarrhea. She is able to manage this at home well and she is currently followed by [facility] for ongoing management. Additionally, she notes that she has a long standing h/o depression which is related to her chronic diseases, most significantly her diabetes. She is currently undergoing therapy for this and is managed by a psychiatrist at [facility] which she is happy with.   CURRENT THERAPY: Surveillance    INTERVAL HISTORY  Jasmin Simmons returns for a follow up of her right breast DCIS. She was last seen by me 1 year ago. She presents  to the clinic today accompanied by a family member.  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 Her nephrologist does not think she needs dialysis anytime soon.  She notes she has been doing self breast exams. She notes she had osteopenia shown on her DEXA. She also notes her SIBO was reevaluated by another physicians for 2nd opinion who thought this was gastroparesis. Her diet was changed and she lost weight from this weight change. She was treated with antibiotics and Linzess.     MEDICAL HISTORY:  Past Medical History:  Diagnosis Date  . Anemia   . Anxiety   . Cancer [facility])    breast cancer, stage 0  (radiation)  . Depression   . Diabetes mellitus without complication (HCC)    Type I  . Fibroid   . Gastroparesis    & rectal dysfunction  . GERD (gastroesophageal reflux disease)   . Hypertension   . Hypothyroidism   . PTSD (post-traumatic stress disorder)    secondary to eye problems  . Small intestinal bacterial overgrowth   . Thyroid disease    hypothyroidism    SURGICAL HISTORY: Past Surgical History:  Procedure Laterality Date  . BREAST BIOPSY Right 8/98   stereotactic core bx, epith hyperplasia  . CARPAL TUNNEL RELEASE Bilateral   . CATARACT EXTRACTION    . CESAREAN SECTION    . ENDOMETRIAL ABLATION    . PARS PLANA VITRECTOMY    . REPLACEMENT TOTAL KNEE Left 2016  . ROTATOR CUFF REPAIR Right   . TRIGGER FINGER RELEASE  Bilateral   . TRIGGER FINGER RELEASE Left 12/04/2015   Procedure: RELEASE A-1 PULLEY LEFT RING FINGER;  Surgeon: Daryll Brod, MD;  Location: [place];  Service: Orthopedics;  Laterality: Left;  ANESTHESIA: IV REGIONAL UPPER ARM    SOCIAL HISTORY: Social History   Socioeconomic History  . Marital status: Married    Spouse name: Not on file  . Number of children: Not on file  . Years of education: Not on file  . Highest education level: Not on file  Occupational History  . Not on file  Social Needs  . Financial  resource strain: Not on file  . Food insecurity:    Worry: Not on file    Inability: Not on file  . Transportation needs:    Medical: Not on file    Non-medical: Not on file  Tobacco Use  . Smoking status: Former Smoker    Years: 15.00    Last attempt to quit: 05/26/1996    Years since quitting: 21.6  . Smokeless tobacco: Never Used  Substance and Sexual Activity  . Alcohol use: No    Alcohol/week: 0.0 standard drinks    Frequency: Never    Comment: social  . Drug use: No  . Sexual activity: Yes    Birth control/protection: Post-menopausal  Lifestyle  . Physical activity:    Days per week: Not on file    Minutes per session: Not on file  . Stress: Not on file  Relationships  . Social connections:    Talks on phone: Not on file    Gets together: Not on file    Attends religious service: Not on file    Active member of club or organization: Not on file    Attends meetings of clubs or organizations: Not on file    Relationship status: Not on file  . Intimate partner violence:    Fear of current or ex partner: Not on file    Emotionally abused: Not on file    Physically abused: Not on file    Forced sexual activity: Not on file  Other Topics Concern  . Not on file  Social History Narrative  . Not on file    FAMILY HISTORY: Family History  Problem Relation Age of Onset  . Cancer Mother 71       uterian cancer   . Stroke Father     ALLERGIES:  is allergic to tobramycin-dexamethasone; ciprofloxacin; and codeine.  MEDICATIONS:  Current Outpatient Medications  Medication Sig Dispense Refill  . atorvastatin (LIPITOR) 40 MG tablet Take 40 mg by mouth once a day  1  . buPROPion (WELLBUTRIN SR) 200 MG 12 hr tablet Take 200 mg by mouth 2 (two) times daily.     . cholecalciferol (VITAMIN D) 1000 units tablet Take 2,000 Units by mouth daily.    . Cyanocobalamin (B-12 PO) Take 1 tablet by mouth daily with breakfast.    . diazepam (VALIUM) 5 MG tablet Take 10 mg by mouth at  bedtime.     . docusate sodium (COLACE) 100 MG capsule Take 200 mg by mouth daily.    . hyoscyamine (LEVSIN SL) 0.125 MG SL tablet Place 0.125 mg under the tongue every 6 (six) hours as needed for cramping.     . insulin glulisine (APIDRA) 100 UNIT/ML injection See admin instructions. Average total of 35 units/day PER INSULIN PUMP    . KRISTALOSE 20 g packet Dissolve the contents of 1 packet into 4 ounces of water  and drink once a day prn  3  . latanoprost (XALATAN) 0.005 % ophthalmic solution at bedtime.    Marland Kitchen levothyroxine (SYNTHROID, LEVOTHROID) 100 MCG tablet Take 100 mcg by mouth daily before breakfast.    . LINZESS 290 MCG CAPS capsule Take 290 mcg by mouth as needed.   3  . nitrofurantoin, macrocrystal-monohydrate, (MACROBID) 100 MG capsule Take 1 capsule (100 mg total) by mouth daily. 30 capsule 12  . ondansetron (ZOFRAN-ODT) 8 MG disintegrating tablet Take 8 mg by mouth every 8 (eight) hours as needed.    . pantoprazole (PROTONIX) 40 MG tablet Take 40 mg by mouth 2 (two) times daily.     Vladimir Faster Glycol-Propyl Glycol (SYSTANE PRESERVATIVE FREE OP) Place 1-2 drops into both eyes 2 (two) times daily as needed (for irritation).     . Propylene Glycol (SYSTANE COMPLETE OP) Apply to eye.    . ramipril (ALTACE) 10 MG capsule Take 10 mg by mouth once a day  2  . temazepam (RESTORIL) 15 MG capsule Take 15 mg by mouth at bedtime.    Marland Kitchen trimethoprim (TRIMPEX) 100 MG tablet Take 1 tablet (100 mg total) by mouth daily. (Patient not taking: Reported on 01/15/2018) 30 tablet 12   No current facility-administered medications for this visit.     REVIEW OF SYSTEMS:   Constitutional: Denies fevers, chills or abnormal night sweats Eyes: Denies blurriness of vision, double vision or watery eyes Ears, nose, mouth, throat, and face: Denies mucositis or sore throat Respiratory: Denies cough, dyspnea or wheezes Cardiovascular: Denies palpitation, chest discomfort or lower extremity  swelling Gastrointestinal:  Denies nausea, heartburn or change in bowel habits Skin: Denies abnormal skin rashes Lymphatics: Denies new lymphadenopathy or easy bruising Neurological:Denies numbness, tingling or new weaknesses Behavioral/Psych: Mood is stable, no new changes  All other systems were reviewed with the patient and are negative.  PHYSICAL EXAMINATION:  ECOG PERFORMANCE STATUS: 0  Vitals:   01/15/18 1325  BP: (!) 145/61  Pulse: 64  Resp: 17  Temp: 98.1 F (36.7 C)  SpO2: 100%   Filed Weights   01/15/18 1325  Weight: 168 lb 9.6 oz (76.5 kg)    GENERAL:alert, no distress and comfortable SKIN: skin color, texture, turgor are normal, no rashes or significant lesions EYES: normal, conjunctiva are pink and non-injected, sclera clear OROPHARYNX:no exudate, no erythema and lips, buccal mucosa, and tongue normal  NECK: supple, thyroid normal size, non-tender, without nodularity LYMPH:  no palpable lymphadenopathy in the cervical, axillary or inguinal LUNGS: clear to auscultation and percussion with normal breathing effort HEART: regular rate & rhythm and no murmurs and no lower extremity edema ABDOMEN:abdomen soft, non-tender and normal bowel sounds. Insulin pump in place.  Musculoskeletal:no cyanosis of digits and no clubbing  PSYCH: alert & oriented x 3 with fluent speech NEURO: no focal motor/sensory deficits Breast: (+) Scar tissue at incision site, no other palpable mass or adenopathy.     LABORATORY DATA:  I have reviewed the data as listed CBC Latest Ref Rng & Units 12/12/2016 08/09/2009  WBC 4.0 - 10.5 K/uL 7.2 -  Hemoglobin 12.0 - 15.0 g/dL 12.0 14.1  Hematocrit 36.0 - 46.0 % 38.2 -  Platelets 150 - 400 K/uL 270 -   CMP Latest Ref Rng & Units 12/12/2016 11/29/2015 08/08/2009  Glucose 65 - 99 mg/dL 267(H) 94 228(H)  BUN 6 - 20 mg/dL 28(H) 19 21  Creatinine 0.44 - 1.00 mg/dL 1.66(H) 1.57(H) 1.14  Sodium 135 - 145 mmol/L 137 138  134(L)  Potassium 3.5 - 5.1  mmol/L 4.8 5.2(H) 4.9  Chloride 101 - 111 mmol/L 107 110 105  CO2 22 - 32 mmol/L 22 23 24   Calcium 8.9 - 10.3 mg/dL 8.8(L) 9.2 8.8  Total Protein 6.5 - 8.1 g/dL 6.4(L) - -  Total Bilirubin 0.3 - 1.2 mg/dL 0.3 - -  Alkaline Phos 38 - 126 U/L 126 - -  AST 15 - 41 U/L 31 - -  ALT 14 - 54 U/L 22 - -    RADIOGRAPHIC STUDIES: I have personally reviewed the radiological images as listed and agreed with the findings in the report.   MM Screening Breast B/l 12/29/17 IMPRESSION: No mammographic evidence of malignancy. A result letter of this screening mammogram will be mailed directly to the patient. RECOMMENDATION: Screening mammogram in one year.  Bone Density Scan 08/10/17 ASSESSMENT: The BMD measured at Femur Neck Left is 0.737 g/cm2 with a T-score of -2.2. This patient is considered OSTEOPENIC according to [facility]) criteria. Lumbar spine was not utilized due to advanced degenerative changes. There has been a statistically significant decrease in BMD of Left hip since prior exam dated 03/10/2007. Site Region Measured Date Measured Age YA T-score BMD Significant CHANGE DualFemur Neck Left 08/10/2017 70.6 -2.2 0.737 g/cm2 * Left Forearm Radius 33% 08/10/2017 70.6 -1.1 0.790 g/cm2   MM Diag Breast Tomo B/l 11/11/16 IMPRESSION: No mammographic evidence for malignancy.  MM Breast B/l w wo contrast  IMPRESSION  1.  Post biopsy changes in the central right breast with no residual malignancy identified. 2.  No evidence of malignancy elsewhere in either breast.  MM Weir R 02/18/20111 IMPRESSION: 4 mm cluster of right breast calcifications, suspicious for the possibility of malignancy.  Biopsy is recommended.  The options of stereotactic guided core needle biopsy and surgical excisional biopsy were discussed with the patient.  Stereotactic guided core needle biopsy will be performed later today.   BI-RADS CATEGORY 4:  Suspicious abnormality -  biopsy should be considered.  PATHOLOGY REPORTS:   Surgical Pathology 08/09/2009 Diagnosis 1. BREAST, LUMPECTOMY, RIGHT : BIOPSY SITE REACTION AND FIBROCYSTIC CHANGES.NO RESIDUAL DUCTAL CARCINOMA IDENTIFIED.MARGINS CLEAR. 2. LYMPH NODE, SENTINEL, BIOPSY, RIGHT : ONE BENIGN LYMPH NODE. 3. LYMPH NODE, SENTINEL, BIOPSY, RIGHT : ONE BENIGN LYMPH NODE. MICROSCOPIC DESCRIPTION 1. In the localized area there is fibrosis and inflammation consistent with biopsy site reaction.The surrounding breast tissue shows fibrocystic changes including duct ectasia, columnar cell alteration, focal adenosis and focal usual ductal hyperplasia without atypia.No residual ductal carcinoma in situ is identified and no invasive carcinoma is identified.Immunohistochemistry for calponin, p63, and smooth muscle myosin is performed and the areas of concern show positive basal cell staining which supports a benign diagnosis.This would correspond to a ptis, pn0, pmx.(Jdp:Gt, 08/13/09).  Surgical Pathology 07/13/2009 Diagnosis  1. BREAST, RIGHT, NEEDLE CORE BIOPSY, CENTRAL : DUCTAL CARCINOMA IN SITU WITH ASSOCIATED CALCIFICATIONS AND EXTRAVASATED STROMAL MUCIN SUSPICIOUS FOR INVASIVE MUCINOUS COMPONENT. Comments Prognostic indicators - acis Interpretation: This in-situ tumor is positive for estrogen receptor and positive for progesterone receptor expression. Results Immunohistochemical and morphometric analysis by the automated cellular imaging system (acis) Estrogen receptor (negative, <1%): 99%, positive Progesterone receptor (negative, <1%): 99%, positive  ASSESSMENT & PLAN:  72 y.o. postmenopausal woman, presented with screening discovered right breast DCIS in 2011.  1. Right breast DCIS, ER+/PR+ -Previously underwent lumpectomy by Dr Ninfa Linden and prophylactic radiation in 2011, has been doing well since.  -Her DCIS was cured by complete surgical resection.  Any form of adjuvant therapy is  preventive. -She is at high risk for breast cancer in the future. -We previously discussed the chemoprevention with tamoxifen or anastrozole for DCIS. Given her history of disease beginning in 2011 and without evidence of recurrence, I do not recommend her to begin antiestrogen therapies.  -We previously discussed breast cancer surveillance, Including annual mammogram, breast exam every 6-12 months. She has been very compliant with breast cancer screening -07/2017 DEXA showed osteopenia with lowest T-score at left femur neck at -2.2 I recommend she take calcium and Vit D. I also recommend she do light weight baring exercise.  -She is clinically doing well. Her physical exam and her 12/2017 mammogram were unremarkable. There is no clinical concern for recurrence. -From a breast DCIS standpoint she has been doing well for several years now. I will discharge her from my clinic and she will follow up with her Gynecologist regularly for breast exams. She plans to see her GYN in 05/2018 and will order her next Mammogram which is due in 12/2018.    2. Type 1 Diabetes, HTN, SIBO, Stage IV CKD -She has been maintaining her sugars well at home. She does have an insulin pump and is on insulin injections.  -She reports that due to her diabetes she was diagnosed with SIBO at [facility], which will intermittently cause her some nausea and constipation. She was placed on a no fiber diet, which previously caused her to lose approximately 20lbs. Her weight has been overall stable lately.  -She will continue to f/u with GI and Dr Alyson Ingles office every three months.  -She was recently diagnosed with stage IV renal failure, secondary to DM. She is not in need of dialysis anytime soon. She will continue to follow up with her nephrologist.   3. Depression  -Mostly related to her chronic health issues.  -She is currently followed by a psychiatrist at [facility] for this with good response to her treatment.    Plan -She is clinically  doing well, no concern for recurrence -I will discharge her from my clinic, she will follow-up with her gynecologist for annual breast exam and mammogram -I will see her as needed in the future  No orders of the defined types were placed in this encounter.   All questions were answered. The patient knows to call the clinic with any problems, questions or concerns.  I spent 15 minutes counseling the patient face to face. The total time spent in the appointment was 20 minutes and more than 50% was on counseling.  Oneal Deputy, am acting as scribe for Truitt Merle, MD.   I have reviewed the above documentation for accuracy and completeness, and I agree with the above.     Truitt Merle, MD 01/15/2018 1:57 PM

## 2018-01-15 ENCOUNTER — Inpatient Hospital Stay: Payer: Medicare Other

## 2018-01-15 ENCOUNTER — Inpatient Hospital Stay: Payer: Medicare Other | Attending: Hematology | Admitting: Hematology

## 2018-01-15 VITALS — BP 145/61 | HR 64 | Temp 98.1°F | Resp 17 | Ht 66.0 in | Wt 168.6 lb

## 2018-01-15 DIAGNOSIS — Z86000 Personal history of in-situ neoplasm of breast: Secondary | ICD-10-CM

## 2018-01-15 DIAGNOSIS — I129 Hypertensive chronic kidney disease with stage 1 through stage 4 chronic kidney disease, or unspecified chronic kidney disease: Secondary | ICD-10-CM | POA: Diagnosis not present

## 2018-01-15 DIAGNOSIS — E1022 Type 1 diabetes mellitus with diabetic chronic kidney disease: Secondary | ICD-10-CM

## 2018-01-15 DIAGNOSIS — D0511 Intraductal carcinoma in situ of right breast: Secondary | ICD-10-CM

## 2018-01-15 DIAGNOSIS — Z79899 Other long term (current) drug therapy: Secondary | ICD-10-CM

## 2018-01-15 DIAGNOSIS — F329 Major depressive disorder, single episode, unspecified: Secondary | ICD-10-CM | POA: Diagnosis not present

## 2018-01-15 DIAGNOSIS — Z87891 Personal history of nicotine dependence: Secondary | ICD-10-CM | POA: Insufficient documentation

## 2018-01-15 DIAGNOSIS — M8580 Other specified disorders of bone density and structure, unspecified site: Secondary | ICD-10-CM | POA: Diagnosis not present

## 2018-01-15 DIAGNOSIS — Z794 Long term (current) use of insulin: Secondary | ICD-10-CM

## 2018-01-15 DIAGNOSIS — Z923 Personal history of irradiation: Secondary | ICD-10-CM | POA: Diagnosis not present

## 2018-01-15 DIAGNOSIS — N184 Chronic kidney disease, stage 4 (severe): Secondary | ICD-10-CM | POA: Diagnosis not present

## 2018-01-16 ENCOUNTER — Encounter: Payer: Self-pay | Admitting: Hematology

## 2018-01-18 ENCOUNTER — Telehealth: Payer: Self-pay

## 2018-01-18 NOTE — Telephone Encounter (Signed)
F/u as needed in future. Per 8/23 los

## 2018-05-23 ENCOUNTER — Other Ambulatory Visit: Payer: Self-pay

## 2018-05-23 ENCOUNTER — Encounter (HOSPITAL_COMMUNITY): Payer: Self-pay | Admitting: Emergency Medicine

## 2018-05-23 ENCOUNTER — Emergency Department (HOSPITAL_COMMUNITY)
Admission: EM | Admit: 2018-05-23 | Discharge: 2018-05-23 | Disposition: A | Discharge status: Home / Self Care | Payer: Medicare Other | Attending: Emergency Medicine | Admitting: Emergency Medicine

## 2018-05-23 DIAGNOSIS — Z79899 Other long term (current) drug therapy: Secondary | ICD-10-CM | POA: Insufficient documentation

## 2018-05-23 DIAGNOSIS — E039 Hypothyroidism, unspecified: Secondary | ICD-10-CM | POA: Insufficient documentation

## 2018-05-23 DIAGNOSIS — Z87891 Personal history of nicotine dependence: Secondary | ICD-10-CM | POA: Diagnosis not present

## 2018-05-23 DIAGNOSIS — Z96652 Presence of left artificial knee joint: Secondary | ICD-10-CM | POA: Insufficient documentation

## 2018-05-23 DIAGNOSIS — M5442 Lumbago with sciatica, left side: Secondary | ICD-10-CM | POA: Diagnosis not present

## 2018-05-23 DIAGNOSIS — Z794 Long term (current) use of insulin: Secondary | ICD-10-CM | POA: Insufficient documentation

## 2018-05-23 DIAGNOSIS — I10 Essential (primary) hypertension: Secondary | ICD-10-CM | POA: Diagnosis not present

## 2018-05-23 DIAGNOSIS — M5432 Sciatica, left side: Secondary | ICD-10-CM

## 2018-05-23 DIAGNOSIS — M545 Low back pain: Secondary | ICD-10-CM | POA: Diagnosis present

## 2018-05-23 DIAGNOSIS — E109 Type 1 diabetes mellitus without complications: Secondary | ICD-10-CM | POA: Insufficient documentation

## 2018-05-23 MED ORDER — OXYCODONE-ACETAMINOPHEN 5-325 MG PO TABS
1.0000 | ORAL_TABLET | Freq: Once | ORAL | Status: AC
  Administered 2018-05-23: 1 via ORAL
  Filled 2018-05-23: qty 1

## 2018-05-23 MED ORDER — DIAZEPAM 5 MG PO TABS
2.5000 mg | ORAL_TABLET | Freq: Once | ORAL | Status: AC
  Administered 2018-05-23: 2.5 mg via ORAL
  Filled 2018-05-23: qty 1

## 2018-05-23 MED ORDER — OXYCODONE-ACETAMINOPHEN 5-325 MG PO TABS: 1.0000 | ORAL_TABLET | ORAL | 0 refills | Status: DC | PRN

## 2018-05-23 MED ORDER — PREDNISONE 20 MG PO TABS
40.0000 mg | ORAL_TABLET | Freq: Once | ORAL | Status: AC
  Administered 2018-05-23: 40 mg via ORAL
  Filled 2018-05-23: qty 2

## 2018-05-23 MED ORDER — PREDNISONE 20 MG PO TABS: 20.0000 mg | ORAL_TABLET | Freq: Every day | ORAL | 0 refills | Status: DC

## 2018-05-23 NOTE — ED Triage Notes (Signed)
Pt states she has sciatica and thinks she might be having a flare up on left leg. Pt states the pain shoots from her left side lower back shooting to her knee. Pt has total knee replacement left knee. Hx of DM type 1. Pain started 12/23 and has gotten worse.

## 2018-05-23 NOTE — ED Provider Notes (Signed)
[facility] EMERGENCY DEPARTMENT Provider Note   CSN: [id number] Arrival date & time: 05/23/18  1541     History   Chief Complaint Chief Complaint  Patient presents with  . Leg Pain    HPI Jasmin Simmons is a 72 y.o. female.  HPI   72 year old female with left leg pain.  Onset of pain was approximately 1 week ago.  Progressive and persistent since then.  Pain is tolerable at rest.  Sniffily worse with any type of movement.  Past history of what she calls sciatica.  She suspects he may be having a flare.  Pain radiates from her left lower back into her left buttock and posterior thigh at about the level of the knee.    Past Medical History:  Diagnosis Date  . Anemia   . Anxiety   . Cancer [facility])    breast cancer, stage 0  (radiation)  . Depression   . Diabetes mellitus without complication (HCC)    Type I  . Fibroid   . Gastroparesis    & rectal dysfunction  . GERD (gastroesophageal reflux disease)   . Hypertension   . Hypothyroidism   . PTSD (post-traumatic stress disorder)    secondary to eye problems  . Small intestinal bacterial overgrowth   . Thyroid disease    hypothyroidism    Patient Active Problem List   Diagnosis Date Noted  . Type 1 diabetes ([place]) 06/05/2017  . Renal insufficiency 06/05/2017  . 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 Pain of left hand 11/14/2015  . Degenerative arthritis of finger 11/14/2015  . Triggering of digit 11/14/2015  . 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 Proliferative diabetic retinopathy ([place]) 05/14/2015  . Macular puckering of retina, bilateral 05/14/2015  . Asymmetrical left sensorineural hearing loss 09/12/2014  . Brow ptosis 01/16/2014  . Dermatochalasis of eyelid 01/16/2014  . Pseudoaphakia 04/14/2013  . Primary open angle glaucoma 02/10/2013    Past Surgical History:  Procedure Laterality Date  . BREAST BIOPSY Right 8/98   stereotactic core bx, epith hyperplasia  .  CARPAL TUNNEL RELEASE Bilateral   . CATARACT EXTRACTION    . CESAREAN SECTION    . ENDOMETRIAL ABLATION    . PARS PLANA VITRECTOMY    . REPLACEMENT TOTAL KNEE Left 2016  . ROTATOR CUFF REPAIR Right   . TRIGGER FINGER RELEASE Bilateral   . TRIGGER FINGER RELEASE Left 12/04/2015   Procedure: RELEASE A-1 PULLEY LEFT RING FINGER;  Surgeon: Daryll Brod, MD;  Location: [place] [place];  Service: Orthopedics;  Laterality: Left;  ANESTHESIA: IV REGIONAL UPPER ARM     OB History    Gravida  1   Para  1   Term  1   Preterm      AB      Living  1     SAB      TAB      Ectopic      Multiple      Live Births               Home Medications    Prior to Admission medications   Medication Sig Start Date End Date Taking? Authorizing Provider  atorvastatin (LIPITOR) 40 MG tablet Take 40 mg by mouth once a day 08/28/15   [provider]  buPROPion (WELLBUTRIN SR) 200 MG 12 hr tablet Take 200 mg by mouth 2 (two) times daily.     [provider]  cholecalciferol (VITAMIN D) 1000 units tablet Take 2,000 Units by mouth daily.    [provider]  Cyanocobalamin (B-12 PO) Take 1 tablet by mouth daily with breakfast.    [provider]  diazepam (VALIUM) 5 MG tablet Take 10 mg by mouth at bedtime.     [provider]  docusate sodium (COLACE) 100 MG capsule Take 200 mg by mouth daily.    [provider]  hyoscyamine (LEVSIN SL) 0.125 MG SL tablet Place 0.125 mg under the tongue every 6 (six) hours as needed for cramping.  12/11/15   [provider]  insulin glulisine (APIDRA) 100 UNIT/ML injection See admin instructions. Average total of 35 units/day PER INSULIN PUMP    [provider]  KRISTALOSE 20 g packet Dissolve the contents of 1 packet into 4 ounces of water and drink once a day prn 09/28/15   [provider]  latanoprost (XALATAN) 0.005 % ophthalmic solution at bedtime. 04/03/17   [provider]  levothyroxine (SYNTHROID, LEVOTHROID) 100 MCG tablet Take 100 mcg by mouth daily before breakfast.    [provider]  LINZESS 290 MCG CAPS capsule Take 290 mcg by mouth as needed.  11/05/15   [provider]  nitrofurantoin, macrocrystal-monohydrate, (MACROBID) 100 MG capsule Take 1 capsule (100 mg total) by mouth daily. 07/16/17   Megan Salon, MD  ondansetron (ZOFRAN-ODT) 8 MG disintegrating tablet Take 8 mg by mouth every 8 (eight) hours as needed. 12/11/15   [provider]  pantoprazole (PROTONIX) 40 MG tablet Take 40 mg by mouth 2 (two) times daily.     [provider]  Polyethyl Glycol-Propyl Glycol (SYSTANE PRESERVATIVE FREE OP) Place 1-2 drops into both eyes 2 (two) times daily as needed (for irritation).     [provider]  Propylene Glycol (SYSTANE COMPLETE OP) Apply to eye.    [provider]  ramipril (ALTACE) 10 MG capsule Take 10 mg by mouth once a day 11/06/15   [provider]  temazepam (RESTORIL) 15 MG capsule Take 15 mg by mouth at bedtime.    [provider]  trimethoprim (TRIMPEX) 100 MG tablet Take 1 tablet (100 mg total) by mouth daily. Patient not taking: Reported on 01/15/2018 06/05/17   Megan Salon, MD    Family History Family History  Problem Relation Age of Onset  . Cancer Mother 40       uterian cancer   . Stroke Father     Social History Social History   Tobacco Use  . Smoking status: Former Smoker    Years: 15.00    Last attempt to quit: 05/26/1996    Years since quitting: 22.0  . Smokeless tobacco: Never Used  Substance Use Topics  . Alcohol use: No    Alcohol/week: 0.0 standard drinks    Frequency: Never    Comment: social  . Drug use: No     Allergies   Tobramycin-dexamethasone; Ciprofloxacin; and Codeine   Review of Systems Review of Systems  All systems reviewed and negative, other than as noted in HPI.  Physical Exam Updated Vital Signs BP (!)  147/72 (BP Location: Right Arm)   Pulse 70   Temp 99.6 F (37.6 C) (Oral)   Resp 16   LMP 07/25/1994   SpO2 100%   Physical Exam Vitals signs and nursing note reviewed.  Constitutional:      General: She is not in acute distress.    Appearance: She is well-developed.  HENT:     Head: Normocephalic and atraumatic.  Eyes:     General:        Right eye: No discharge.        Left eye: No discharge.     Conjunctiva/sclera: Conjunctivae normal.  Neck:     Musculoskeletal: Neck supple.  Cardiovascular:     Rate and Rhythm: Normal rate and regular rhythm.     Heart sounds: Normal heart sounds. No murmur. No friction rub. No gallop.   Pulmonary:     Effort: Pulmonary effort is normal. No respiratory distress.     Breath sounds: Normal breath sounds.  Abdominal:     General: There is no distension.     Palpations: Abdomen is soft.     Tenderness: There is no abdominal tenderness.  Musculoskeletal:        General: Tenderness present.     Comments: Is to palpation in the left buttock extending posterior thigh to about mid thigh.  No overlying skin changes.  Neurovascular intact distally.  No midline spinal tenderness.  Positive straight leg test.  Skin:    General: Skin is warm and dry.  Neurological:     Mental Status: She is alert.  Psychiatric:        Behavior: Behavior normal.        Thought Content: Thought content normal.      ED Treatments / Results  Labs (all labs ordered are listed, but only abnormal results are displayed) Labs Reviewed - No data to display  EKG None  Radiology No results found.  Procedures Procedures (including critical care time)  Medications Ordered in ED Medications  oxyCODONE-acetaminophen (PERCOCET/ROXICET) 5-325 MG per tablet 1 tablet (has no administration in time range)  diazepam (VALIUM) tablet 2.5 mg (has no administration in time range)  predniSONE (DELTASONE) tablet 40 mg (has no administration in time range)     Initial  Impression / Assessment and Plan / ED Course  I have reviewed the triage vital signs and the nursing notes.  Pertinent labs & imaging results that were available during my care of the patient were reviewed by me and considered in my medical decision making (see chart for details).     72yF with symptoms consistent with sciatica. Would not use NSAIDs with her renal history. She is also type 1 diabetic but has been for 50+ years. She is very cognizant of her glucose and I think would benefit from short course of steroids. PRN pain meds. Return precautions discussed. Outpt FU otherwise.    Final Clinical Impressions(s) / ED Diagnoses   Final diagnoses:  Sciatica of left side    ED Discharge Orders    None       Virgel Manifold, MD 06/02/18 1444

## 2018-05-23 NOTE — ED Notes (Signed)
Patient verbalizes understanding of discharge instructions. Opportunity for questioning and answers were provided. Armband removed by staff, pt discharged from ED in wheelchair.  

## 2018-05-23 NOTE — ED Notes (Signed)
ED Provider at bedside. 

## 2018-06-02 DIAGNOSIS — Z6828 Body mass index (BMI) 28.0-28.9, adult: Secondary | ICD-10-CM | POA: Diagnosis not present

## 2018-06-02 DIAGNOSIS — Z794 Long term (current) use of insulin: Secondary | ICD-10-CM | POA: Diagnosis not present

## 2018-06-02 DIAGNOSIS — E10319 Type 1 diabetes mellitus with unspecified diabetic retinopathy without macular edema: Secondary | ICD-10-CM | POA: Diagnosis not present

## 2018-06-02 DIAGNOSIS — N184 Chronic kidney disease, stage 4 (severe): Secondary | ICD-10-CM | POA: Diagnosis not present

## 2018-06-02 DIAGNOSIS — Z4681 Encounter for fitting and adjustment of insulin pump: Secondary | ICD-10-CM | POA: Diagnosis not present

## 2018-06-02 DIAGNOSIS — I10 Essential (primary) hypertension: Secondary | ICD-10-CM | POA: Diagnosis not present

## 2018-06-16 DIAGNOSIS — M19041 Primary osteoarthritis, right hand: Secondary | ICD-10-CM | POA: Diagnosis not present

## 2018-06-22 DIAGNOSIS — F341 Dysthymic disorder: Secondary | ICD-10-CM | POA: Diagnosis not present

## 2018-07-12 DIAGNOSIS — H401134 Primary open-angle glaucoma, bilateral, indeterminate stage: Secondary | ICD-10-CM | POA: Diagnosis not present

## 2018-07-12 DIAGNOSIS — H402234 Chronic angle-closure glaucoma, bilateral, indeterminate stage: Secondary | ICD-10-CM | POA: Diagnosis not present

## 2018-07-12 DIAGNOSIS — Z961 Presence of intraocular lens: Secondary | ICD-10-CM | POA: Diagnosis not present

## 2018-07-16 DIAGNOSIS — H35373 Puckering of macula, bilateral: Secondary | ICD-10-CM | POA: Diagnosis not present

## 2018-07-16 DIAGNOSIS — E103593 Type 1 diabetes mellitus with proliferative diabetic retinopathy without macular edema, bilateral: Secondary | ICD-10-CM | POA: Diagnosis not present

## 2018-07-16 DIAGNOSIS — Z961 Presence of intraocular lens: Secondary | ICD-10-CM | POA: Diagnosis not present

## 2018-07-16 DIAGNOSIS — H401134 Primary open-angle glaucoma, bilateral, indeterminate stage: Secondary | ICD-10-CM | POA: Diagnosis not present

## 2018-07-26 DIAGNOSIS — I83812 Varicose veins of left lower extremities with pain: Secondary | ICD-10-CM | POA: Diagnosis not present

## 2018-07-26 DIAGNOSIS — I83892 Varicose veins of left lower extremities with other complications: Secondary | ICD-10-CM | POA: Diagnosis not present

## 2018-07-26 DIAGNOSIS — M79605 Pain in left leg: Secondary | ICD-10-CM | POA: Diagnosis not present

## 2018-07-30 DIAGNOSIS — I83812 Varicose veins of left lower extremities with pain: Secondary | ICD-10-CM | POA: Diagnosis not present

## 2018-08-02 DIAGNOSIS — E038 Other specified hypothyroidism: Secondary | ICD-10-CM | POA: Diagnosis not present

## 2018-08-02 DIAGNOSIS — D631 Anemia in chronic kidney disease: Secondary | ICD-10-CM | POA: Diagnosis not present

## 2018-08-02 DIAGNOSIS — H401134 Primary open-angle glaucoma, bilateral, indeterminate stage: Secondary | ICD-10-CM | POA: Diagnosis not present

## 2018-08-02 DIAGNOSIS — E1040 Type 1 diabetes mellitus with diabetic neuropathy, unspecified: Secondary | ICD-10-CM | POA: Diagnosis not present

## 2018-08-02 DIAGNOSIS — E162 Hypoglycemia, unspecified: Secondary | ICD-10-CM | POA: Diagnosis not present

## 2018-08-02 DIAGNOSIS — N2581 Secondary hyperparathyroidism of renal origin: Secondary | ICD-10-CM | POA: Diagnosis not present

## 2018-08-02 DIAGNOSIS — I10 Essential (primary) hypertension: Secondary | ICD-10-CM | POA: Diagnosis not present

## 2018-08-02 DIAGNOSIS — E7800 Pure hypercholesterolemia, unspecified: Secondary | ICD-10-CM | POA: Diagnosis not present

## 2018-08-02 DIAGNOSIS — F331 Major depressive disorder, recurrent, moderate: Secondary | ICD-10-CM | POA: Diagnosis not present

## 2018-08-02 DIAGNOSIS — E103593 Type 1 diabetes mellitus with proliferative diabetic retinopathy without macular edema, bilateral: Secondary | ICD-10-CM | POA: Diagnosis not present

## 2018-08-02 DIAGNOSIS — Z794 Long term (current) use of insulin: Secondary | ICD-10-CM | POA: Diagnosis not present

## 2018-08-02 DIAGNOSIS — N184 Chronic kidney disease, stage 4 (severe): Secondary | ICD-10-CM | POA: Diagnosis not present

## 2018-08-16 ENCOUNTER — Other Ambulatory Visit: Payer: Self-pay | Admitting: Obstetrics & Gynecology

## 2018-08-17 NOTE — Telephone Encounter (Signed)
Medication refill request: macrobid  Last AEX:  06/05/17 SM Next AEX: none Last MMG (if hormonal medication request): 12/29/17 BIRADS2:benign.  Refill authorized: 07/16/17 #[id number]. Today please advise.

## 2018-08-31 DIAGNOSIS — E10319 Type 1 diabetes mellitus with unspecified diabetic retinopathy without macular edema: Secondary | ICD-10-CM | POA: Diagnosis not present

## 2018-08-31 DIAGNOSIS — Z4681 Encounter for fitting and adjustment of insulin pump: Secondary | ICD-10-CM | POA: Diagnosis not present

## 2018-08-31 DIAGNOSIS — I10 Essential (primary) hypertension: Secondary | ICD-10-CM | POA: Diagnosis not present

## 2018-08-31 DIAGNOSIS — N184 Chronic kidney disease, stage 4 (severe): Secondary | ICD-10-CM | POA: Diagnosis not present

## 2018-08-31 DIAGNOSIS — Z794 Long term (current) use of insulin: Secondary | ICD-10-CM | POA: Diagnosis not present

## 2018-09-09 ENCOUNTER — Ambulatory Visit (INDEPENDENT_AMBULATORY_CARE_PROVIDER_SITE_OTHER): Payer: Medicare Other

## 2018-09-09 ENCOUNTER — Other Ambulatory Visit: Payer: Self-pay

## 2018-09-09 ENCOUNTER — Encounter (INDEPENDENT_AMBULATORY_CARE_PROVIDER_SITE_OTHER): Payer: Self-pay | Admitting: Orthopaedic Surgery

## 2018-09-09 ENCOUNTER — Ambulatory Visit (INDEPENDENT_AMBULATORY_CARE_PROVIDER_SITE_OTHER): Payer: Medicare Other | Admitting: Orthopaedic Surgery

## 2018-09-09 VITALS — BP 178/84 | HR 90 | Ht 67.0 in | Wt 173.0 lb

## 2018-09-09 DIAGNOSIS — M5441 Lumbago with sciatica, right side: Secondary | ICD-10-CM

## 2018-09-09 DIAGNOSIS — M5416 Radiculopathy, lumbar region: Secondary | ICD-10-CM

## 2018-09-09 DIAGNOSIS — M5442 Lumbago with sciatica, left side: Secondary | ICD-10-CM

## 2018-09-09 DIAGNOSIS — G8929 Other chronic pain: Secondary | ICD-10-CM

## 2018-09-09 NOTE — Progress Notes (Signed)
Office Visit Note   Patient: Jasmin Simmons           Date of Birth: 1946/04/13           MRN: [id number] Visit Date: 09/09/2018              Requested by: Haywood Pao, MD [address] [place], [place] [address] PCP: Haywood Pao, MD   Assessment & Plan: Visit Diagnoses:  1. Chronic bilateral low back pain with bilateral sciatica   2. 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  I suspect this originates from the lumbar spine although there may be some component of diabetic neuropathy.  Long discussion over 45 minutes 50% of the time in counseling regarding the above.  I did start with an MRI of her lumbar spine.  I do not see a specific problem with her left hip although she has some mild arthritis I do not think is symptomatic.  Appears that her knee replacement is fine.  I do not see a specific problem with her left ankle  Follow-Up Instructions: No follow-ups on file.   Orders:  Orders Placed This Encounter  Procedures  . XR Lumbar Spine 2-3 Views  . XR Pelvis 1-2 Views   No orders of the defined types were placed in this encounter.     Procedures: No procedures performed   Clinical Data: No additional findings.   Subjective: Chief Complaint  Patient presents with  . Left Ankle - Pain  Patient presents today for left ankle pain. Patient states that her ankle has been hurting since December. Patient states that she went to the ER for left sided sciatica on 05/23/18. She said that she was given oxycodone and takes that occasionally. She said that she cannot determine where the ankle pain is located. She states that she is having throughout her left leg. Mrs. Boden relates initial onset of back pain associated with radiculopathy into her right lower extremity after a fall about 5 years ago.  She also "broke many bones".  He eventually was diagnosed  with arthritis of her left knee and underwent a total knee replacement.  She had a series of cortisone injections into her "right buttock".  The back pain appeared to resolve until recurrent symptoms occurred late December 2019.  She went to the emergency room and was given a prescription for oxycodone.  She notes that because of the COVID-19 she has been hesitant but presently is having trouble with pain starting in the low back radiating into her left buttock she is a type I diabetic medicines including insulin.  Does have neuropathy with burning and tingling into both ankles she does not really have any specific problems with her knee replacement she is not having any symptoms referable to her right lower extremity.  She does have some chronic issues with her kidneys and is being followed at [facility]  She was given a prescription for Percocet in the emergency room in December and uses them sparingly.  Has not had any prescription renewal since then. Mrs. Bottomley also relates that she has had some difficulty with her back and leg the longer she stands or the further she walks. HPI  Review of Systems  Constitutional: Positive for fatigue.  HENT: Negative for ear pain.   Eyes: Negative for pain.  Respiratory: Negative for shortness of breath.   Cardiovascular:  Positive for leg swelling.  Gastrointestinal: Positive for constipation. Negative for diarrhea.  Endocrine: Positive for cold intolerance. Negative for heat intolerance.  Genitourinary: Negative for difficulty urinating.  Musculoskeletal: Negative for joint swelling.  Skin: Negative for rash.  Allergic/Immunologic: Negative for food allergies.  Neurological: Positive for weakness.  Hematological: Does not bruise/bleed easily.  Psychiatric/Behavioral: Positive for sleep disturbance.     Objective: Vital Signs: BP (!) 178/84   Pulse 90   Ht 5\' 7"  (1.702 m)   Wt 173 lb (78.5 kg)   LMP 07/25/1994   BMI 27.10 kg/m   Physical Exam  Constitutional:      Appearance: She is well-developed.  Eyes:     Pupils: Pupils are equal, round, and reactive to light.  Pulmonary:     Effort: Pulmonary effort is normal.  Skin:    General: Skin is warm and dry.  Neurological:     Mental Status: She is alert and oriented to person, place, and time.  Psychiatric:        Behavior: Behavior normal.     Ortho Exam awake alert and oriented x3.  Comfortable sitting.  No percussible back pain.  Absent.  No particular areas of tenderness about her left hip or right hip.left total knee replacement without erythema or effusion.  No instability.  Well-healed incision.  No localized areas of tenderness.  Straight leg raise negative.  No swelling about either ankle.  Does have some altered sensibility to both feet related to her diabetic neuropathy.  Skin intact.. Good pulses to both feet.  Specialty Comments:  No specialty comments available.  Imaging: No results found.   PMFS History: Patient Active Problem List   Diagnosis Date Noted  . Lumbar radiculopathy 09/09/2018  . Chronic bilateral low back pain with bilateral sciatica 09/09/2018  . Type 1 diabetes ([place]) 06/05/2017  . Renal insufficiency 06/05/2017  . 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 Pain of left hand 11/14/2015  . Degenerative arthritis of finger 11/14/2015  . Triggering of digit 11/14/2015  . 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 Proliferative diabetic retinopathy ([place]) 05/14/2015  . Macular puckering of retina, bilateral 05/14/2015  . Asymmetrical left sensorineural hearing loss 09/12/2014  . Brow ptosis 01/16/2014  . Dermatochalasis of eyelid 01/16/2014  . Pseudoaphakia 04/14/2013  . Primary open angle glaucoma 02/10/2013   Past Medical History:  Diagnosis Date  . Anemia   . Anxiety   . Cancer [facility])    breast cancer, stage 0  (radiation)  . Depression   . Diabetes mellitus without complication (HCC)    Type I  . Fibroid   .  Gastroparesis    & rectal dysfunction  . GERD (gastroesophageal reflux disease)   . Hypertension   . Hypothyroidism   . PTSD (post-traumatic stress disorder)    secondary to eye problems  . Small intestinal bacterial overgrowth   . Thyroid disease    hypothyroidism    Family History  Problem Relation Age of Onset  . Cancer Mother 50       uterian cancer   . Stroke Father     Past Surgical History:  Procedure Laterality Date  . BREAST BIOPSY Right 8/98   stereotactic core bx, epith hyperplasia  . CARPAL TUNNEL RELEASE Bilateral   . CATARACT EXTRACTION    . CESAREAN SECTION    . ENDOMETRIAL ABLATION    . PARS PLANA VITRECTOMY    . REPLACEMENT TOTAL KNEE Left 2016  . ROTATOR CUFF  REPAIR Right   . TRIGGER FINGER RELEASE Bilateral   . TRIGGER FINGER RELEASE Left 12/04/2015   Procedure: RELEASE A-1 PULLEY LEFT RING FINGER;  Surgeon: Daryll Brod, MD;  Location: [facility];  Service: Orthopedics;  Laterality: Left;  ANESTHESIA: IV REGIONAL UPPER ARM   Social History   Occupational History  . Not on file  Tobacco Use  . Smoking status: Former Smoker    Years: 15.00    Last attempt to quit: 05/26/1996    Years since quitting: 22.3  . Smokeless tobacco: Never Used  Substance and Sexual Activity  . Alcohol use: No    Alcohol/week: 0.0 standard drinks    Frequency: Never    Comment: social  . Drug use: No  . Sexual activity: Yes    Birth control/protection: Post-menopausal

## 2018-09-15 DIAGNOSIS — N184 Chronic kidney disease, stage 4 (severe): Secondary | ICD-10-CM | POA: Diagnosis not present

## 2018-09-17 ENCOUNTER — Ambulatory Visit
Admission: RE | Admit: 2018-09-17 | Discharge: 2018-09-17 | Disposition: A | Discharge status: Home / Self Care | Payer: Medicare Other | Source: Ambulatory Visit | Attending: Orthopaedic Surgery | Admitting: Orthopaedic Surgery

## 2018-09-17 ENCOUNTER — Other Ambulatory Visit: Payer: Self-pay

## 2018-09-17 DIAGNOSIS — M48061 Spinal stenosis, lumbar region without neurogenic claudication: Secondary | ICD-10-CM | POA: Diagnosis not present

## 2018-09-17 DIAGNOSIS — M5416 Radiculopathy, lumbar region: Secondary | ICD-10-CM

## 2018-09-17 DIAGNOSIS — G8929 Other chronic pain: Secondary | ICD-10-CM

## 2018-09-17 DIAGNOSIS — M5442 Lumbago with sciatica, left side: Principal | ICD-10-CM

## 2018-09-17 DIAGNOSIS — M5441 Lumbago with sciatica, right side: Principal | ICD-10-CM

## 2018-09-30 ENCOUNTER — Encounter: Payer: Self-pay | Admitting: Orthopaedic Surgery

## 2018-09-30 ENCOUNTER — Ambulatory Visit (INDEPENDENT_AMBULATORY_CARE_PROVIDER_SITE_OTHER): Payer: Medicare Other | Admitting: Orthopaedic Surgery

## 2018-09-30 VITALS — BP 186/81 | HR 72 | Ht 67.0 in | Wt 173.0 lb

## 2018-09-30 DIAGNOSIS — E103593 Type 1 diabetes mellitus with proliferative diabetic retinopathy without macular edema, bilateral: Secondary | ICD-10-CM | POA: Diagnosis not present

## 2018-09-30 DIAGNOSIS — M5441 Lumbago with sciatica, right side: Secondary | ICD-10-CM

## 2018-09-30 DIAGNOSIS — G8929 Other chronic pain: Secondary | ICD-10-CM

## 2018-09-30 DIAGNOSIS — M5416 Radiculopathy, lumbar region: Secondary | ICD-10-CM

## 2018-09-30 DIAGNOSIS — M5442 Lumbago with sciatica, left side: Secondary | ICD-10-CM | POA: Diagnosis not present

## 2018-09-30 MED ORDER — OXYCODONE HCL 5 MG PO CAPS: 5.0000 mg | ORAL_CAPSULE | ORAL | 0 refills | Status: DC | PRN

## 2018-09-30 NOTE — Progress Notes (Signed)
Office Visit Note   Patient: Jasmin Simmons           Date of Birth: [birth year]           MRN: [id number] Visit Date: 09/30/2018              Requested by: Haywood Pao, MD [address] [place], [place] [address] PCP: Osborne Casco Fransico Him, MD   Assessment & Plan: Visit Diagnoses:  1. Chronic bilateral low back pain with bilateral sciatica   2. Lumbar radiculopathy     Plan: MRI scan demonstrates multilevel lumbar spondylosis.  Symptomatic level is likely L4-5 with there was severe left lateral recess stenosis affecting the descending left L5 nerve root there was moderate to severe right lateral recess stenosis at this level it could affect the descending right L5 nerve root.  Patient is not symptomatic on the right.  There was also moderate to severe spinal canal stenosis and mild to moderate left neuroforaminal stenosis at L3-4.  Long discussion with [name] and her husband.  We will try a course of physical therapy and asked Dr. Ernestina Patches to perform an epidural steroid injection.  At some point in the future consider referral to Dr. Louanne Skye for consideration of spine decompression.  Will prescribe OxyIR for pain.  Office visit over 30 minutes 50% of the time in counseling  Follow-Up Instructions: Return in about 6 weeks (around 11/11/2018).   Orders:  Orders Placed This Encounter  Procedures  . Ambulatory referral to Physical Therapy  . Ambulatory referral to Physical Medicine Rehab   Meds ordered this encounter  Medications  . oxycodone (OXY-IR) 5 MG capsule    Sig: Take 1 capsule (5 mg total) by mouth every 4 (four) hours as needed.    Dispense:  30 capsule    Refill:  0      Procedures: No procedures performed   Clinical Data: No additional findings.   Subjective: Chief Complaint  Patient presents with  . Lower Back - Follow-up  Patient presents today for lower back follow up. She had an MRI on 09/17/18 and is here today for the results. She has developed more  difficulty walking due to pain since her visit one month ago.  Pain is significantly worse on the left side.  Seems to be worse the longer she stands or the further she walks.  Symptoms are consistent with claudication.  She does have diabetes and has some burning and tingling in her toes consistent with diabetic neuropathy.  Has a insulin pump  HPI  Review of Systems  Constitutional: Positive for fatigue.  HENT: Negative for ear pain.   Eyes: Negative for pain.  Respiratory: Negative for shortness of breath.   Cardiovascular: Negative for leg swelling.  Gastrointestinal: Positive for constipation. Negative for diarrhea.  Endocrine: Positive for heat intolerance. Negative for cold intolerance.  Genitourinary: Negative for difficulty urinating.  Musculoskeletal: Negative for joint swelling.  Skin: Negative for rash.  Allergic/Immunologic: Negative for food allergies.  Neurological: Positive for weakness.  Hematological: Does not bruise/bleed easily.  Psychiatric/Behavioral: Negative for sleep disturbance.     Objective: Vital Signs: BP (!) 186/81   Pulse 72   Ht 5\' 7"  (1.702 m)   Wt 173 lb (78.5 kg)   LMP 07/25/1994   BMI 27.10 kg/m   Physical Exam Constitutional:      Appearance: She is well-developed.  Eyes:     Pupils: Pupils are equal, round, and reactive to light.  Pulmonary:  Effort: Pulmonary effort is normal.  Skin:    General: Skin is warm and dry.  Neurological:     Mental Status: She is alert and oriented to person, place, and time.  Psychiatric:        Behavior: Behavior normal.     Ortho Exam awake alert and oriented x3.  Comfortable sitting.  Painless range of motion of both hips and both knees.  Does have some burning and tingling in both feet consistent with a diabetic neuropathy.  Some mild percussible tenderness lower lumbar spine in the parasacral region but not specifically over the sacroiliac joints.  Specialty Comments:  No specialty comments  available.  Imaging: No results found.   PMFS History: Patient Active Problem List   Diagnosis Date Noted  . Lumbar radiculopathy 09/09/2018  . Chronic bilateral low back pain with bilateral sciatica 09/09/2018  . Type 1 diabetes ([place]) 06/05/2017  . Renal insufficiency 06/05/2017  . 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 Pain of left hand 11/14/2015  . Degenerative arthritis of finger 11/14/2015  . Triggering of digit 11/14/2015  . 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 Proliferative diabetic retinopathy ([place]) 05/14/2015  . Macular puckering of retina, bilateral 05/14/2015  . Asymmetrical left sensorineural hearing loss 09/12/2014  . Brow ptosis 01/16/2014  . Dermatochalasis of eyelid 01/16/2014  . Pseudoaphakia 04/14/2013  . Primary open angle glaucoma 02/10/2013   Past Medical History:  Diagnosis Date  . Anemia   . Anxiety   . Cancer [facility])    breast cancer, stage 0  (radiation)  . Depression   . Diabetes mellitus without complication (HCC)    Type I  . Fibroid   . Gastroparesis    & rectal dysfunction  . GERD (gastroesophageal reflux disease)   . Hypertension   . Hypothyroidism   . PTSD (post-traumatic stress disorder)    secondary to eye problems  . Small intestinal bacterial overgrowth   . Thyroid disease    hypothyroidism    Family History  Problem Relation Age of Onset  . Cancer Mother 98       uterian cancer   . Stroke Father     Past Surgical History:  Procedure Laterality Date  . BREAST BIOPSY Right 8/98   stereotactic core bx, epith hyperplasia  . CARPAL TUNNEL RELEASE Bilateral   . CATARACT EXTRACTION    . CESAREAN SECTION    . ENDOMETRIAL ABLATION    . PARS PLANA VITRECTOMY    . REPLACEMENT TOTAL KNEE Left 2016  . ROTATOR CUFF REPAIR Right   . TRIGGER FINGER RELEASE Bilateral   . TRIGGER FINGER RELEASE Left 12/04/2015   Procedure: RELEASE A-1 PULLEY LEFT RING FINGER;  Surgeon: Daryll Brod, MD;  Location: [facility];  Service: Orthopedics;  Laterality: Left;  ANESTHESIA: IV REGIONAL UPPER ARM   Social History   Occupational History  . Not on file  Tobacco Use  . Smoking status: Former Smoker    Years: 15.00    Last attempt to quit: 05/26/1996    Years since quitting: 22.3  . Smokeless tobacco: Never Used  Substance and Sexual Activity  . Alcohol use: No    Alcohol/week: 0.0 standard drinks    Frequency: Never    Comment: social  . Drug use: No  . Sexual activity: Yes    Birth control/protection: Post-menopausal

## 2018-10-05 DIAGNOSIS — I10 Essential (primary) hypertension: Secondary | ICD-10-CM | POA: Diagnosis not present

## 2018-10-05 DIAGNOSIS — Z4681 Encounter for fitting and adjustment of insulin pump: Secondary | ICD-10-CM | POA: Diagnosis not present

## 2018-10-05 DIAGNOSIS — Z794 Long term (current) use of insulin: Secondary | ICD-10-CM | POA: Diagnosis not present

## 2018-10-05 DIAGNOSIS — N184 Chronic kidney disease, stage 4 (severe): Secondary | ICD-10-CM | POA: Diagnosis not present

## 2018-10-05 DIAGNOSIS — E10319 Type 1 diabetes mellitus with unspecified diabetic retinopathy without macular edema: Secondary | ICD-10-CM | POA: Diagnosis not present

## 2018-10-11 DIAGNOSIS — M79605 Pain in left leg: Secondary | ICD-10-CM | POA: Diagnosis not present

## 2018-10-11 DIAGNOSIS — I83812 Varicose veins of left lower extremities with pain: Secondary | ICD-10-CM | POA: Diagnosis not present

## 2018-10-11 DIAGNOSIS — M79652 Pain in left thigh: Secondary | ICD-10-CM | POA: Diagnosis not present

## 2018-10-12 ENCOUNTER — Other Ambulatory Visit: Payer: Self-pay

## 2018-10-12 ENCOUNTER — Ambulatory Visit: Payer: Medicare Other | Attending: Orthopaedic Surgery | Admitting: Physical Therapy

## 2018-10-12 ENCOUNTER — Encounter: Payer: Self-pay | Admitting: Physical Therapy

## 2018-10-12 DIAGNOSIS — M6281 Muscle weakness (generalized): Secondary | ICD-10-CM | POA: Insufficient documentation

## 2018-10-12 DIAGNOSIS — M79662 Pain in left lower leg: Secondary | ICD-10-CM | POA: Diagnosis not present

## 2018-10-12 DIAGNOSIS — G8929 Other chronic pain: Secondary | ICD-10-CM | POA: Diagnosis not present

## 2018-10-12 DIAGNOSIS — M5442 Lumbago with sciatica, left side: Secondary | ICD-10-CM | POA: Insufficient documentation

## 2018-10-12 NOTE — Patient Instructions (Addendum)
Access Code: [email or web address]  URL: [email or web address]  Date: 10/12/2018  Prepared by: Sherol Dade   Exercises  Supine Lower Trunk Rotation - 10-15 reps - 2x daily - 7x weekly

## 2018-10-12 NOTE — Therapy (Signed)
[facility] [address], [place], [place], [address] Phone: [phone]   Fax:  [phone]  Physical Therapy Evaluation  Patient Details  Name: Jasmin Simmons MRN: [id number] Date of Birth: 1945/07/23 Referring Provider (PT): Joni Fears, MD    Encounter Date: 10/12/2018  PT End of Session - 10/12/18 1351    Visit Number  1    Date for PT Re-Evaluation  11/12/18    Authorization Type  Medicare A and B    Authorization Time Period  10/12/18 to 11/12/18    PT Start Time  1308    PT Stop Time  1350    PT Time Calculation (min)  42 min    Activity Tolerance  No increased pain;Patient tolerated treatment well    Behavior During Therapy  [facility] for tasks assessed/performed       Past Medical History:  Diagnosis Date  . Anemia   . Anxiety   . Cancer [facility])    breast cancer, stage 0  (radiation)  . Depression   . Diabetes mellitus without complication (HCC)    Type I  . Fibroid   . Gastroparesis    & rectal dysfunction  . GERD (gastroesophageal reflux disease)   . Hypertension   . Hypothyroidism   . PTSD (post-traumatic stress disorder)    secondary to eye problems  . Small intestinal bacterial overgrowth   . Thyroid disease    hypothyroidism    Past Surgical History:  Procedure Laterality Date  . BREAST BIOPSY Right 8/98   stereotactic core bx, epith hyperplasia  . CARPAL TUNNEL RELEASE Bilateral   . CATARACT EXTRACTION    . CESAREAN SECTION    . ENDOMETRIAL ABLATION    . PARS PLANA VITRECTOMY    . REPLACEMENT TOTAL KNEE Left 2016  . ROTATOR CUFF REPAIR Right   . TRIGGER FINGER RELEASE Bilateral   . TRIGGER FINGER RELEASE Left 12/04/2015   Procedure: RELEASE A-1 PULLEY LEFT RING FINGER;  Surgeon: Daryll Brod, MD;  Location: [place];  Service: Orthopedics;  Laterality: Left;  ANESTHESIA: IV REGIONAL UPPER ARM    There were no vitals filed for this visit.   Subjective Assessment -  10/12/18 1313    Subjective  Pt states that she started feeling like something "bit her in the butt" at the first of the year. At times, this will travel down her leg. She has had a steroid injection and saw the MD who encouraged her to come to PT. She was also scheduled for an injection.    Pertinent History  DM, Lt TKR, GERD     Patient Stated Goals  exercises to complete at home so that she can avoid surgery    Currently in Pain?  No/denies    Aggravating Factors   weather, doing housework, being active    Pain Relieving Factors  new shoes helped         [facility] PT Assessment - 10/12/18 0001      Assessment   Medical Diagnosis  Lumbar radiculpathy    Referring Provider (PT)  Joni Fears, MD     Onset Date/Surgical Date  --   January 2020   Next MD Visit  injection in 2 weeks    Prior Therapy  none for this       Precautions   Precautions  None      Balance Screen   Has the patient fallen in the past 6 months  No  Has the patient had a decrease in activity level because of a fear of falling?   No    Is the patient reluctant to leave their home because of a fear of falling?   No      Prior Function   Level of Independence  Independent      Cognition   Overall Cognitive Status  Within Functional Limits for tasks assessed      Observation/Other Assessments   Focus on Therapeutic Outcomes (FOTO)   next visit, time constraints       Sensation   Additional Comments  pt reports numbness/tingling but feels this is due to neuropathy       ROM / Strength   AROM / PROM / Strength  AROM;Strength      AROM   AROM Assessment Site  Lumbar    Lumbar Flexion  pain free     Lumbar Extension  75% limited, pain in Lt buttock     Lumbar - Right Rotation  Rt limited more than Lt (+) pain both directions       Strength   Strength Assessment Site  Hip;Knee;Ankle    Right/Left Hip  Right;Left    Right Hip Flexion  4/5    Right Hip Extension  3/5    Right Hip ABduction  3/5     Left Hip Flexion  4/5    Left Hip Extension  3/5    Left Hip ABduction  3/5    Right/Left Knee  Right;Left    Right Knee Extension  5/5    Left Knee Extension  5/5    Right/Left Ankle  Right;Left    Right Ankle Dorsiflexion  5/5    Left Ankle Dorsiflexion  4/5      Palpation   Palpation comment  hypomobile and tender Lt proximal tibiofibular joint, hypomobile lumbar spine CPAs, trigger points Lt gluteals       Transfers   Five time sit to stand comments   14 sec no UE                Objective measurements completed on examination: See above findings.      [place] Adult PT Treatment/Exercise - 10/12/18 0001      Exercises   Exercises  Lumbar      Lumbar Exercises: Supine   Other Supine Lumbar Exercises  hooklying trunk rotation, knees to the Rt only x10 reps       Manual Therapy   Manual therapy comments  Lt lumbar rotation mobilization Grade III x2 bouts in Rt sidelying; Lt AP and PA proximal fibular mobilizations              PT Education - 10/12/18 1450    Education Details  eval findings/POC; HEP implemented    Person(s) Educated  Patient    Methods  Explanation;Handout;Verbal cues;Tactile cues    Comprehension  Verbalized understanding;Returned demonstration       PT Short Term Goals - 10/12/18 1451      PT SHORT TERM GOAL #1   Title  Pt will be independent with her initial HEP to improve body mechanics, flexibility and strength.     Time  1    Period  Weeks    Status  New    Target Date  10/19/18        PT Long Term Goals - 10/12/18 1452      PT LONG TERM GOAL #1   Title  Pt will have atleast 4/5  MMT strength of BLE to improve her efficiency with daily activity.     Time  4    Period  Weeks    Status  New    Target Date  11/12/18      PT LONG TERM GOAL #2   Title  Pt will report being able to complete atleast 1 lap on the path around her home without the need for a rest break.     Time  4    Period  Weeks    Status  New      PT  LONG TERM GOAL #3   Title  Pt will report atleast 40% improvement in her pain/symptoms from the start of PT which will increase her activity participation at home and in the community.     Time  4    Status  New    Target Date  11/12/18      PT LONG TERM GOAL #4   Title  Pt will be able to demonstrate proper log roll technique with transitioning in/out of bed, in order to decrease pain and strain on her low back.     Time  4    Period  Weeks    Status  New             Plan - 10/12/18 1353    Clinical Impression Statement  Pt is a pleasant 73 y.o presenting to PT with complaints of Lt buttock pain and intermittent radiating pain down posterolateral aspect of the Lt LE. Pt also notes the pain occasionally travels down to her lateral ankle. Pt is relatively sedentary with multiple comorbidities as well as Lt TKE in 2016 which was ended early secondary other medical issues. Pt's Lt tibiofibular joint is hypomobile and painful with palpation, which could be the source of her distal LE pain. Her proximal LE pain is mostly reproduced with trunk rotation and extension, but was nearly eliminated following rotation mobilization in Rt sidelying during today's visit. Pt would benefit from skilled PT to address her limitations in lumbar mobility, hip strength/mobility and facilitate her return to every day activity without limitation or risk of invasive surgery.     Personal Factors and Comorbidities  Age;Comorbidity 3+;Sex;Fitness    Comorbidities  kidney disease, Lt TKA, HTN    Examination-Activity Limitations  Other    Examination-Participation Restrictions  Yard Work;Laundry;Cleaning    Stability/Clinical Decision Making  Evolving/Moderate complexity    Clinical Decision Making  Moderate    Rehab Potential  Good    PT Frequency  2x / week    PT Duration  6 weeks    PT Treatment/Interventions  ADLs/Self Care Home Management;Electrical Stimulation;Therapeutic activities;Therapeutic  exercise;Patient/family education;Neuromuscular re-education;Dry needling    PT Next Visit Plan  log roll education; additions to HEP; lumbar and hip flexibility; hip extensor/abductor strengthening       Patient will benefit from skilled therapeutic intervention in order to improve the following deficits and impairments:  Decreased endurance, Decreased mobility, Increased muscle spasms, Obesity, Improper body mechanics, Decreased range of motion, Decreased safety awareness, Decreased activity tolerance, Decreased strength, Postural dysfunction, Pain, Impaired flexibility  Visit Diagnosis: Chronic left-sided low back pain with left-sided sciatica  Pain in left lower leg  Muscle weakness (generalized)     Problem List Patient Active Problem List   Diagnosis Date Noted  . Lumbar radiculopathy 09/09/2018  . Chronic bilateral low back pain with bilateral sciatica 09/09/2018  . Type 1 diabetes ([place]) 06/05/2017  . Renal insufficiency  06/05/2017  . 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 Pain of left hand 11/14/2015  . Degenerative arthritis of finger 11/14/2015  . Triggering of digit 11/14/2015  . 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 Proliferative diabetic retinopathy ([place]) 05/14/2015  . Macular puckering of retina, bilateral 05/14/2015  . Asymmetrical left sensorineural hearing loss 09/12/2014  . Brow ptosis 01/16/2014  . Dermatochalasis of eyelid 01/16/2014  . Pseudoaphakia 04/14/2013  . Primary open angle glaucoma 02/10/2013    Sherol Dade 10/12/2018, 3:00 PM  [facility] [address], [place], [place], [address] Phone: [phone]   Fax:  [phone]  Name: Jasmin Simmons MRN: [id number] Date of Birth: May 24, 1946

## 2018-10-14 ENCOUNTER — Encounter: Payer: Medicare Other | Admitting: Physical Therapy

## 2018-10-22 ENCOUNTER — Ambulatory Visit: Payer: Medicare Other | Admitting: Physical Therapy

## 2018-10-22 ENCOUNTER — Encounter: Payer: Self-pay | Admitting: Physical Therapy

## 2018-10-22 ENCOUNTER — Other Ambulatory Visit: Payer: Self-pay

## 2018-10-22 DIAGNOSIS — M5442 Lumbago with sciatica, left side: Secondary | ICD-10-CM | POA: Diagnosis not present

## 2018-10-22 DIAGNOSIS — M79662 Pain in left lower leg: Secondary | ICD-10-CM

## 2018-10-22 DIAGNOSIS — M6281 Muscle weakness (generalized): Secondary | ICD-10-CM | POA: Diagnosis not present

## 2018-10-22 DIAGNOSIS — G8929 Other chronic pain: Secondary | ICD-10-CM | POA: Diagnosis not present

## 2018-10-22 NOTE — Therapy (Signed)
[facility] [address] [place], [place], [address] Phone: [phone]   Fax:  [phone]  Physical Therapy Treatment  Patient Details  Name: Jasmin Simmons MRN: [id number] Date of Birth: [birth year] Referring Provider (PT): Joni Fears, MD    Encounter Date: 10/22/2018  PT End of Session - 10/22/18 1206    Visit Number  2    Date for PT Re-Evaluation  11/12/18    Authorization Type  Medicare A and B    Authorization Time Period  10/12/18 to 11/12/18    PT Start Time  1112    PT Stop Time  1200    PT Time Calculation (min)  48 min    Activity Tolerance  Patient tolerated treatment well       Past Medical History:  Diagnosis Date  . Anemia   . Anxiety   . Cancer [facility])    breast cancer, stage 0  (radiation)  . Depression   . Diabetes mellitus without complication (HCC)    Type I  . Fibroid   . Gastroparesis    & rectal dysfunction  . GERD (gastroesophageal reflux disease)   . Hypertension   . Hypothyroidism   . PTSD (post-traumatic stress disorder)    secondary to eye problems  . Small intestinal bacterial overgrowth   . Thyroid disease    hypothyroidism    Past Surgical History:  Procedure Laterality Date  . BREAST BIOPSY Right 8/98   stereotactic core bx, epith hyperplasia  . CARPAL TUNNEL RELEASE Bilateral   . CATARACT EXTRACTION    . CESAREAN SECTION    . ENDOMETRIAL ABLATION    . PARS PLANA VITRECTOMY    . REPLACEMENT TOTAL KNEE Left 2016  . ROTATOR CUFF REPAIR Right   . TRIGGER FINGER RELEASE Bilateral   . TRIGGER FINGER RELEASE Left 12/04/2015   Procedure: RELEASE A-1 PULLEY LEFT RING FINGER;  Surgeon: Daryll Brod, MD;  Location: [place];  Service: Orthopedics;  Laterality: Left;  ANESTHESIA: IV REGIONAL UPPER ARM    There were no vitals filed for this visit.  Subjective Assessment - 10/22/18 1113    Subjective  Patient arrives 12 min late.  My blood sugar is 119.   I've been doing my exercise.  Calf and lower leg but it starts in my buttock.  I'm supposed to start dialysis sometime soon.  Scheduled for steroid injection on June 8th.     Pertinent History  DM, Lt TKR, GERD     How long can you sit comfortably?  depends on the day     How long can you walk comfortably?  < 1/2 block    Currently in Pain?  Yes    Pain Orientation  Left         [facility] PT Assessment - 10/22/18 0001      Observation/Other Assessments   Focus on Therapeutic Outcomes (FOTO)   46% limitation                    [facility] Adult PT Treatment/Exercise - 10/22/18 0001      Lumbar Exercises: Stretches   Piriformis Stretch  Left;3 reps;30 seconds    Piriformis Stretch Limitations  seated      Lumbar Exercises: Supine   Other Supine Lumbar Exercises  hooklying trunk rotation, knees to the Rt only x10 reps     Other Supine Lumbar Exercises  2 variations of supine piriformis stretch  Moist Heat Therapy   Number Minutes Moist Heat  5 Minutes    Moist Heat Location  Hip      Manual Therapy   Manual therapy comments  contract relax hip rotators 3x;  pelvic distractions grade 3 5x    Soft tissue mobilization  left gluteals and piriformis       Trigger Point Dry Needling - 10/22/18 0001    Consent Given?  Yes    Education Handout Provided  Yes    Muscles Treated Back/Hip  Gluteus minimus;Gluteus medius;Gluteus maximus;Piriformis    Gluteus Minimus Response  Palpable increased muscle length    Gluteus Medius Response  Palpable increased muscle length    Gluteus Maximus Response  Palpable increased muscle length    Piriformis Response  Palpable increased muscle length           PT Education - 10/22/18 1154    Education Details   Access Code: [email or web address]  seated and supine piriformis stretches; dry needling after care    Person(s) Educated  Patient    Methods  Explanation;Demonstration;Handout    Comprehension  Returned demonstration;Verbalized understanding        PT Short Term Goals - 10/12/18 1451      PT SHORT TERM GOAL #1   Title  Pt will be independent with her initial HEP to improve body mechanics, flexibility and strength.     Time  1    Period  Weeks    Status  New    Target Date  10/19/18        PT Long Term Goals - 10/12/18 1452      PT LONG TERM GOAL #1   Title  Pt will have atleast 4/5 MMT strength of BLE to improve her efficiency with daily activity.     Time  4    Period  Weeks    Status  New    Target Date  11/12/18      PT LONG TERM GOAL #2   Title  Pt will report being able to complete atleast 1 lap on the path around her home without the need for a rest break.     Time  4    Period  Weeks    Status  New      PT LONG TERM GOAL #3   Title  Pt will report atleast 40% improvement in her pain/symptoms from the start of PT which will increase her activity participation at home and in the community.     Time  4    Status  New    Target Date  11/12/18      PT LONG TERM GOAL #4   Title  Pt will be able to demonstrate proper log roll technique with transitioning in/out of bed, in order to decrease pain and strain on her low back.     Time  4    Period  Weeks    Status  New            Plan - 10/22/18 1207    Clinical Impression Statement  The patient demonstrates good carryover with intial home instructions.  She has tender points in piriformis and gluteal muscles which may be contributing to symptoms in her lower leg as well.  She is receptive to trying dry needling and manual therapy as an adjunct to exercises.  Improved muscle length and hip rotation ROM following treatment session.  Therapist closely monitoring response with all treatment interventions.  Comorbidities  kidney disease, Lt TKA, HTN    Examination-Activity Limitations  Other    Examination-Participation Restrictions  Yard Work;Laundry;Cleaning    Rehab Potential  Good    PT Frequency  2x / week    PT Duration  6 weeks    PT  Treatment/Interventions  ADLs/Self Care Home Management;Electrical Stimulation;Therapeutic activities;Therapeutic exercise;Patient/family education;Neuromuscular re-education;Dry needling    PT Next Visit Plan  assess response to DN #1 and piriformis stretching to HEP;  add hip extensor/abductor strengthening       Patient will benefit from skilled therapeutic intervention in order to improve the following deficits and impairments:  Decreased endurance, Decreased mobility, Increased muscle spasms, Obesity, Improper body mechanics, Decreased range of motion, Decreased safety awareness, Decreased activity tolerance, Decreased strength, Postural dysfunction, Pain, Impaired flexibility  Visit Diagnosis: Chronic left-sided low back pain with left-sided sciatica  Pain in left lower leg  Muscle weakness (generalized)     Problem List Patient Active Problem List   Diagnosis Date Noted  . Lumbar radiculopathy 09/09/2018  . Chronic bilateral low back pain with bilateral sciatica 09/09/2018  . Type 1 diabetes ([place]) 06/05/2017  . Renal insufficiency 06/05/2017  . 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 Pain of left hand 11/14/2015  . Degenerative arthritis of finger 11/14/2015  . Triggering of digit 11/14/2015  . 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 Proliferative diabetic retinopathy ([place]) 05/14/2015  . Macular puckering of retina, bilateral 05/14/2015  . Asymmetrical left sensorineural hearing loss 09/12/2014  . Brow ptosis 01/16/2014  . Dermatochalasis of eyelid 01/16/2014  . Pseudoaphakia 04/14/2013  . Primary open angle glaucoma 02/10/2013   Ruben Im, PT 10/22/18 12:13 PM Phone: [phone] Fax: [phone] Alvera Singh 10/22/2018, 12:12 PM  [place] [address], [place], [place], [address] Phone: [phone]   Fax:  [phone]  Name: Jasmin Simmons MRN: [id number] Date of  Birth: [birth year]

## 2018-10-22 NOTE — Patient Instructions (Signed)
Access Code: [email or web address]  URL: [email or web address]  Date: 10/22/2018  Prepared by: Ruben Im   Exercises  Seated Piriformis Stretch - 3 reps - 1 sets - 30 hold - 1x daily - 7x weekly  Supine Figure 4 Piriformis Stretch - 3 reps - 1 sets - 30 hold - 1x daily - 7x weekly  Supine Piriformis Stretch with Foot on Ground - 3 reps - 1 sets - 30 hold - 1x daily - 7x weekly     Trigger Point Dry Needling  . What is Trigger Point Dry Needling (DN)? o DN is a physical therapy technique used to treat muscle pain and dysfunction. Specifically, DN helps deactivate muscle trigger points (muscle knots).  o A thin filiform needle is used to penetrate the skin and stimulate the underlying trigger point. The goal is for a local twitch response (LTR) to occur and for the trigger point to relax. No medication of any kind is injected during the procedure.   . What Does Trigger Point Dry Needling Feel Like?  o The procedure feels different for each individual patient. Some patients report that they do not actually feel the needle enter the skin and overall the process is not painful. Very mild bleeding may occur. However, many patients feel a deep cramping in the muscle in which the needle was inserted. This is the local twitch response.   Marland Kitchen How Will I feel after the treatment? o Soreness is normal, and the onset of soreness may not occur for a few hours. Typically this soreness does not last longer than two days.  o Bruising is uncommon, however; ice can be used to decrease any possible bruising.  o In rare cases feeling tired or nauseous after the treatment is normal. In addition, your symptoms may get worse before they get better, this period will typically not last longer than 24 hours.   . What Can I do After My Treatment? o Increase your hydration by drinking more water for the next 24 hours. o You may place ice or heat on the areas treated that have become sore, however, do not use heat  on inflamed or bruised areas. Heat often brings more relief post needling. o You can continue your regular activities, but vigorous activity is not recommended initially after the treatment for 24 hours. o DN is best combined with other physical therapy such as strengthening, stretching, and other therapies.     Ruben Im PT [facility] [address] [place], [place] [address] Phone # [phone] Fax [phone]

## 2018-10-26 ENCOUNTER — Encounter: Payer: Self-pay | Admitting: Physical Therapy

## 2018-10-26 ENCOUNTER — Other Ambulatory Visit: Payer: Self-pay

## 2018-10-26 ENCOUNTER — Ambulatory Visit: Payer: Medicare Other | Attending: Orthopaedic Surgery | Admitting: Physical Therapy

## 2018-10-26 DIAGNOSIS — M6281 Muscle weakness (generalized): Secondary | ICD-10-CM | POA: Diagnosis not present

## 2018-10-26 DIAGNOSIS — G8929 Other chronic pain: Secondary | ICD-10-CM | POA: Insufficient documentation

## 2018-10-26 DIAGNOSIS — M5442 Lumbago with sciatica, left side: Secondary | ICD-10-CM | POA: Diagnosis not present

## 2018-10-26 DIAGNOSIS — M79662 Pain in left lower leg: Secondary | ICD-10-CM | POA: Diagnosis not present

## 2018-10-26 NOTE — Patient Instructions (Signed)
Access Code: [email or web address]  URL: [email or web address]  Date: 10/26/2018  Prepared by: Ruben Im   Exercises  Seated Piriformis Stretch - 3 reps - 1 sets - 30 hold - 1x daily - 7x weekly  Supine Figure 4 Piriformis Stretch - 3 reps - 1 sets - 30 hold - 1x daily - 7x weekly  Supine Piriformis Stretch with Foot on Ground - 3 reps - 1 sets - 30 hold - 1x daily - 7x weekly  Clamshell - 10 reps - 1 sets - 1x daily - 7x weekly  Supine Bridge - 10 reps - 1 sets - 1x daily - 7x weekly  whole leg press down - 5 reps - 1 sets - 5 hold - 1x daily - 7x weekly

## 2018-10-26 NOTE — Therapy (Signed)
[facility] [address] [place], [place], [address] Phone: [phone]   Fax:  [phone]  Physical Therapy Treatment  Patient Details  Name: Jasmin Simmons MRN: [id number] Date of Birth: 07-13-1945 Referring Provider (PT): Joni Fears, MD    Encounter Date: 10/26/2018  PT End of Session - 10/26/18 1548    Visit Number  3    Date for PT Re-Evaluation  11/12/18    Authorization Type  Medicare A and B    Authorization Time Period  10/12/18 to 11/12/18    PT Start Time  1530    PT Stop Time  1620    PT Time Calculation (min)  50 min    Activity Tolerance  Patient tolerated treatment well       Past Medical History:  Diagnosis Date  . Anemia   . Anxiety   . Cancer [facility])    breast cancer, stage 0  (radiation)  . Depression   . Diabetes mellitus without complication (HCC)    Type I  . Fibroid   . Gastroparesis    & rectal dysfunction  . GERD (gastroesophageal reflux disease)   . Hypertension   . Hypothyroidism   . PTSD (post-traumatic stress disorder)    secondary to eye problems  . Small intestinal bacterial overgrowth   . Thyroid disease    hypothyroidism    Past Surgical History:  Procedure Laterality Date  . BREAST BIOPSY Right 8/98   stereotactic core bx, epith hyperplasia  . CARPAL TUNNEL RELEASE Bilateral   . CATARACT EXTRACTION    . CESAREAN SECTION    . ENDOMETRIAL ABLATION    . PARS PLANA VITRECTOMY    . REPLACEMENT TOTAL KNEE Left 2016  . ROTATOR CUFF REPAIR Right   . TRIGGER FINGER RELEASE Bilateral   . TRIGGER FINGER RELEASE Left 12/04/2015   Procedure: RELEASE A-1 PULLEY LEFT RING FINGER;  Surgeon: Daryll Brod, MD;  Location: [place];  Service: Orthopedics;  Laterality: Left;  ANESTHESIA: IV REGIONAL UPPER ARM    There were no vitals filed for this visit.  Subjective Assessment - 10/26/18 1533    Subjective  I was doing well but I overdid it walking around the  building. I did some housework too.   I think the DN helped.  Less lower leg symptoms     Pertinent History  DM, Lt TKR, GERD     Currently in Pain?  Yes    Pain Score  6     Pain Orientation  Left;Lateral    Pain Type  Chronic pain    Pain Radiating Towards  small spot lateral malleolus                       [facility] Adult PT Treatment/Exercise - 10/26/18 0001      Lumbar Exercises: Stretches   Single Knee to Chest Stretch  Left;5 reps    Other Lumbar Stretch Exercise  using green ball piriformis stretch     Other Lumbar Stretch Exercise  ankle pumps with LEs on ball 10x       Lumbar Exercises: Supine   Bridge  10 reps    Bridge Limitations  with green ball     Other Supine Lumbar Exercises  whole leg press down on mat 5x 5 sec hold right/left       Lumbar Exercises: Sidelying   Clam  Right;Left;10 reps      Moist Heat  Therapy   Number Minutes Moist Heat  5 Minutes    Moist Heat Location  Hip      Manual Therapy   Manual therapy comments  --    Soft tissue mobilization  left gluteals and piriformis       Trigger Point Dry Needling - 10/26/18 0001    Consent Given?  Yes    Gluteus Minimus Response  Palpable increased muscle length    Gluteus Medius Response  Palpable increased muscle length    Gluteus Maximus Response  Palpable increased muscle length    Piriformis Response  Palpable increased muscle length           PT Education - 10/26/18 1616    Education Details   Access Code: [email or web address]        PT Short Term Goals - 10/26/18 1622      PT SHORT TERM GOAL #1   Title  Pt will be independent with her initial HEP to improve body mechanics, flexibility and strength.     Status  Achieved        PT Long Term Goals - 10/12/18 1452      PT LONG TERM GOAL #1   Title  Pt will have atleast 4/5 MMT strength of BLE to improve her efficiency with daily activity.     Time  4    Period  Weeks    Status  New    Target Date  11/12/18      PT LONG TERM  GOAL #2   Title  Pt will report being able to complete atleast 1 lap on the path around her home without the need for a rest break.     Time  4    Period  Weeks    Status  New      PT LONG TERM GOAL #3   Title  Pt will report atleast 40% improvement in her pain/symptoms from the start of PT which will increase her activity participation at home and in the community.     Time  4    Status  New    Target Date  11/12/18      PT LONG TERM GOAL #4   Title  Pt will be able to demonstrate proper log roll technique with transitioning in/out of bed, in order to decrease pain and strain on her low back.     Time  4    Period  Weeks    Status  New            Plan - 10/26/18 1616    Clinical Impression Statement  The patient reports an improvement in overall buttock pain and lower leg pain despite muscle soreness following dry needling.  She is receptive to initiation of hip strengthening without an increase in pain.  Therapist providing verbal and tactile cues to activate gluteals instead of compensating with other muscles.  Decreased overall size and number of tender points following DN and manual therapy today and compared to last visit.      Personal Factors and Comorbidities  Age;Comorbidity 3+;Sex;Fitness    Comorbidities  n    Examination-Activity Limitations  Other    Examination-Participation Restrictions  Yard Work;Laundry;Cleaning    Rehab Potential  Good    PT Frequency  2x / week    PT Duration  6 weeks    PT Treatment/Interventions  ADLs/Self Care Home Management;Electrical Stimulation;Therapeutic activities;Therapeutic exercise;Patient/family education;Neuromuscular re-education;Dry needling    PT Next Visit Plan  assess  response to DN #2;  review bridge, hip extensor whole leg press down and clam ex;  progress as tolerated    PT Home Exercise Plan   Access Code: [email or web address]        Patient will benefit from skilled therapeutic intervention in order to improve the following  deficits and impairments:  Decreased endurance, Decreased mobility, Increased muscle spasms, Obesity, Improper body mechanics, Decreased range of motion, Decreased safety awareness, Decreased activity tolerance, Decreased strength, Postural dysfunction, Pain, Impaired flexibility  Visit Diagnosis: Chronic left-sided low back pain with left-sided sciatica  Pain in left lower leg  Muscle weakness (generalized)     Problem List Patient Active Problem List   Diagnosis Date Noted  . Lumbar radiculopathy 09/09/2018  . Chronic bilateral low back pain with bilateral sciatica 09/09/2018  . Type 1 diabetes ([place]) 06/05/2017  . Renal insufficiency 06/05/2017  . 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 Pain of left hand 11/14/2015  . Degenerative arthritis of finger 11/14/2015  . Triggering of digit 11/14/2015  . 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 Proliferative diabetic retinopathy ([place]) 05/14/2015  . Macular puckering of retina, bilateral 05/14/2015  . Asymmetrical left sensorineural hearing loss 09/12/2014  . Brow ptosis 01/16/2014  . Dermatochalasis of eyelid 01/16/2014  . Pseudoaphakia 04/14/2013  . Primary open angle glaucoma 02/10/2013   Ruben Im, PT 10/26/18 4:31 PM Phone: [phone] Fax: [phone] Alvera Singh 10/26/2018, 4:31 PM  [facility] [address] [place], [place], [address] Phone: [phone]   Fax:  [phone]  Name: Jasmin Simmons MRN: [id number] Date of Birth: 1946/04/29

## 2018-10-29 ENCOUNTER — Other Ambulatory Visit: Payer: Self-pay

## 2018-10-29 ENCOUNTER — Encounter: Payer: Self-pay | Admitting: Physical Therapy

## 2018-10-29 ENCOUNTER — Ambulatory Visit: Payer: Medicare Other | Admitting: Physical Therapy

## 2018-10-29 DIAGNOSIS — M79662 Pain in left lower leg: Secondary | ICD-10-CM

## 2018-10-29 DIAGNOSIS — M6281 Muscle weakness (generalized): Secondary | ICD-10-CM

## 2018-10-29 DIAGNOSIS — M5442 Lumbago with sciatica, left side: Secondary | ICD-10-CM | POA: Diagnosis not present

## 2018-10-29 DIAGNOSIS — G8929 Other chronic pain: Secondary | ICD-10-CM

## 2018-10-29 NOTE — Therapy (Signed)
[facility] [address] [place], [place], [address] Phone: [phone]   Fax:  [phone]  Physical Therapy Treatment  Patient Details  Name: Jasmin Simmons MRN: [id number] Date of Birth: Nov 09, 1945 Referring Provider (PT): Joni Fears, MD    Encounter Date: 10/29/2018  PT End of Session - 10/29/18 1315    Visit Number  4    Date for PT Re-Evaluation  11/12/18    Authorization Type  Medicare A and B    Authorization Time Period  10/12/18 to 11/12/18    PT Start Time  1230    PT Stop Time  1315    PT Time Calculation (min)  45 min    Activity Tolerance  Patient tolerated treatment well       Past Medical History:  Diagnosis Date  . Anemia   . Anxiety   . Cancer [facility])    breast cancer, stage 0  (radiation)  . Depression   . Diabetes mellitus without complication (HCC)    Type I  . Fibroid   . Gastroparesis    & rectal dysfunction  . GERD (gastroesophageal reflux disease)   . Hypertension   . Hypothyroidism   . PTSD (post-traumatic stress disorder)    secondary to eye problems  . Small intestinal bacterial overgrowth   . Thyroid disease    hypothyroidism    Past Surgical History:  Procedure Laterality Date  . BREAST BIOPSY Right 8/98   stereotactic core bx, epith hyperplasia  . CARPAL TUNNEL RELEASE Bilateral   . CATARACT EXTRACTION    . CESAREAN SECTION    . ENDOMETRIAL ABLATION    . PARS PLANA VITRECTOMY    . REPLACEMENT TOTAL KNEE Left 2016  . ROTATOR CUFF REPAIR Right   . TRIGGER FINGER RELEASE Bilateral   . TRIGGER FINGER RELEASE Left 12/04/2015   Procedure: RELEASE A-1 PULLEY LEFT RING FINGER;  Surgeon: Daryll Brod, MD;  Location: [place];  Service: Orthopedics;  Laterality: Left;  ANESTHESIA: IV REGIONAL UPPER ARM    There were no vitals filed for this visit.  Subjective Assessment - 10/29/18 1234    Subjective  Pt states that things are improved. She has been  having less buttock pain overall.   (Pended)     Pertinent History  DM, Lt TKR, GERD   (Pended)     Currently in Pain?  No/denies  (Pended)                        [facility] Adult PT Treatment/Exercise - 10/29/18 0001      Ambulation/Gait   Gait Comments  Pt ambulating with decreased push off on Lt, Noted foot held rigid and slightly inverted       Exercises   Exercises  Other Exercises    Other Exercises   Lt ankle eversion with yellow TB x10 reps; Lt ankle PF with yellow TB x15 reps       Manual Therapy   Manual Therapy  Joint mobilization    Joint Mobilization  Grade III-IV AP and PA proximal fibular mobilizations     Soft tissue mobilization  Lt peroneals/soleus/gastroc; Rolling stick Lt vastus lateralis       Trigger Point Dry Needling - 10/29/18 0001    Consent Given?  Yes    Education Handout Provided  Previously provided    Muscles Treated Lower Quadrant  Peroneals;Soleus;Gastrocnemius   Lt   Peroneals Response  Twitch  response elicited;Palpable increased muscle length    Gastrocnemius Response  Twitch response elicited;Palpable increased muscle length    Soleus Response  Twitch response elicited;Palpable increased muscle length           PT Education - 10/29/18 1315    Education Details  updated and reviewed HEP    Person(s) Educated  Patient    Methods  Explanation;Verbal cues;Handout    Comprehension  Returned demonstration;Verbalized understanding       PT Short Term Goals - 10/26/18 1622      PT SHORT TERM GOAL #1   Title  Pt will be independent with her initial HEP to improve body mechanics, flexibility and strength.     Status  Achieved        PT Long Term Goals - 10/12/18 1452      PT LONG TERM GOAL #1   Title  Pt will have atleast 4/5 MMT strength of BLE to improve her efficiency with daily activity.     Time  4    Period  Weeks    Status  New    Target Date  11/12/18      PT LONG TERM GOAL #2   Title  Pt will report being  able to complete atleast 1 lap on the path around her home without the need for a rest break.     Time  4    Period  Weeks    Status  New      PT LONG TERM GOAL #3   Title  Pt will report atleast 40% improvement in her pain/symptoms from the start of PT which will increase her activity participation at home and in the community.     Time  4    Status  New    Target Date  11/12/18      PT LONG TERM GOAL #4   Title  Pt will be able to demonstrate proper log roll technique with transitioning in/out of bed, in order to decrease pain and strain on her low back.     Time  4    Period  Weeks    Status  New            Plan - 10/29/18 1317    Clinical Impression Statement  Pt feels much improvement in the Lt buttock region, however she continues to note Lt lateral knee/lower leg pain with ambulation. Spent a majority of the session using manual techniques to address peroneal, gastroc and fibular mobility. Pt demonstrates poor push off on the Lt during ambulation and her HEP was updated to promote Lt ankle strength into eversion and plantarflexion. She reported pain free ambulation following today's treatment. Will monitor her response over the weekend and progress POC as appropriate.     Personal Factors and Comorbidities  Age;Comorbidity 3+;Sex;Fitness    Comorbidities  n    Examination-Activity Limitations  Other    Examination-Participation Restrictions  Yard Work;Laundry;Cleaning    Rehab Potential  Good    PT Frequency  2x / week    PT Duration  6 weeks    PT Treatment/Interventions  ADLs/Self Care Home Management;Electrical Stimulation;Therapeutic activities;Therapeutic exercise;Patient/family education;Neuromuscular re-education;Dry needling    PT Next Visit Plan  f/u on Lt lower leg pain; review bridge, hip extensor whole leg press down and clam ex; progress as tolerated    PT Home Exercise Plan   Access Code: [email or web address]        Patient will benefit from skilled therapeutic  intervention in order to improve the following deficits and impairments:  Decreased endurance, Decreased mobility, Increased muscle spasms, Obesity, Improper body mechanics, Decreased range of motion, Decreased safety awareness, Decreased activity tolerance, Decreased strength, Postural dysfunction, Pain, Impaired flexibility  Visit Diagnosis: Chronic left-sided low back pain with left-sided sciatica  Pain in left lower leg  Muscle weakness (generalized)     Problem List Patient Active Problem List   Diagnosis Date Noted  . Lumbar radiculopathy 09/09/2018  . Chronic bilateral low back pain with bilateral sciatica 09/09/2018  . Type 1 diabetes ([place]) 06/05/2017  . Renal insufficiency 06/05/2017  . 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 Pain of left hand 11/14/2015  . Degenerative arthritis of finger 11/14/2015  . Triggering of digit 11/14/2015  . 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 Proliferative diabetic retinopathy ([place]) 05/14/2015  . Macular puckering of retina, bilateral 05/14/2015  . Asymmetrical left sensorineural hearing loss 09/12/2014  . Brow ptosis 01/16/2014  . Dermatochalasis of eyelid 01/16/2014  . Pseudoaphakia 04/14/2013  . Primary open angle glaucoma 02/10/2013   1:24 PM,10/29/18 Sherol Dade PT, DPT [facility] [address] [place], [place], [address] Phone: [phone]   Fax:  [phone]  Name: Jasmin Simmons MRN: [id number] Date of Birth: 1945/06/30

## 2018-10-29 NOTE — Patient Instructions (Signed)
Access Code: [email or web address]  URL: [email or web address]  Date: 10/29/2018  Prepared by: Sherol Dade   Exercises  Seated Piriformis Stretch - 3 reps - 1 sets - 30 hold - 1x daily - 7x weekly  Supine Figure 4 Piriformis Stretch - 3 reps - 1 sets - 30 hold - 1x daily - 7x weekly  Supine Piriformis Stretch with Foot on Ground - 3 reps - 1 sets - 30 hold - 1x daily - 7x weekly  Clamshell - 10 reps - 1 sets - 1x daily - 7x weekly  Supine Bridge - 10 reps - 1 sets - 1x daily - 7x weekly  Seated Ankle Eversion with Resistance - 10 reps - 2 sets - 1x daily - 7x weekly  Seated Eccentric Ankle Plantar Flexion with Resistance - Straight Leg - 20 reps - 2 sets - 1x daily - 7x weekly    [facility] [address], [place],  [address] Phone # [phone] Fax [phone]

## 2018-11-01 ENCOUNTER — Encounter: Payer: Self-pay | Admitting: Physical Medicine and Rehabilitation

## 2018-11-01 ENCOUNTER — Other Ambulatory Visit: Payer: Self-pay

## 2018-11-01 ENCOUNTER — Ambulatory Visit (INDEPENDENT_AMBULATORY_CARE_PROVIDER_SITE_OTHER): Payer: Medicare Other | Admitting: Physical Medicine and Rehabilitation

## 2018-11-01 ENCOUNTER — Ambulatory Visit: Payer: Self-pay

## 2018-11-01 VITALS — BP 146/65 | HR 69 | Temp 98.0°F

## 2018-11-01 DIAGNOSIS — M5416 Radiculopathy, lumbar region: Secondary | ICD-10-CM | POA: Diagnosis not present

## 2018-11-01 MED ORDER — BETAMETHASONE SOD PHOS & ACET 6 (3-3) MG/ML IJ SUSP: 12.0000 mg | Freq: Once | INTRAMUSCULAR | Status: AC

## 2018-11-01 NOTE — Progress Notes (Signed)
 .  Numeric Pain Rating Scale and Functional Assessment Average Pain 8   In the last MONTH (on 0-10 scale) has pain interfered with the following?  1. General activity like being  able to carry out your everyday physical activities such as walking, climbing stairs, carrying groceries, or moving a chair?  Rating(8)   +Driver, -BT, -Dye Allergies.  

## 2018-11-05 ENCOUNTER — Ambulatory Visit: Payer: Medicare Other | Admitting: Physical Therapy

## 2018-11-09 ENCOUNTER — Ambulatory Visit: Payer: Medicare Other | Admitting: Physical Therapy

## 2018-11-09 ENCOUNTER — Other Ambulatory Visit: Payer: Self-pay

## 2018-11-09 ENCOUNTER — Encounter: Payer: Self-pay | Admitting: Physical Therapy

## 2018-11-09 DIAGNOSIS — M6281 Muscle weakness (generalized): Secondary | ICD-10-CM

## 2018-11-09 DIAGNOSIS — G8929 Other chronic pain: Secondary | ICD-10-CM

## 2018-11-09 DIAGNOSIS — M5442 Lumbago with sciatica, left side: Secondary | ICD-10-CM

## 2018-11-09 DIAGNOSIS — M79662 Pain in left lower leg: Secondary | ICD-10-CM

## 2018-11-09 NOTE — Therapy (Signed)
[facility] [address] [place], [place], [address] Phone: [phone]   Fax:  [phone]  Physical Therapy Treatment  Patient Details  Name: NINOSHKA WAINWRIGHT MRN: [id number] Date of Birth: 1945/11/03 Referring Provider (PT): Joni Fears, MD    Encounter Date: 11/09/2018  PT End of Session - 11/09/18 1939    Visit Number  5    Date for PT Re-Evaluation  11/12/18    Authorization Type  Medicare A and B    Authorization Time Period  10/12/18 to 11/12/18    PT Start Time  1900    PT Stop Time  1939    PT Time Calculation (min)  39 min    Activity Tolerance  Patient tolerated treatment well       Past Medical History:  Diagnosis Date  . Anemia   . Anxiety   . Cancer [facility])    breast cancer, stage 0  (radiation)  . Depression   . Diabetes mellitus without complication (HCC)    Type I  . Fibroid   . Gastroparesis    & rectal dysfunction  . GERD (gastroesophageal reflux disease)   . Hypertension   . Hypothyroidism   . PTSD (post-traumatic stress disorder)    secondary to eye problems  . Small intestinal bacterial overgrowth   . Thyroid disease    hypothyroidism    Past Surgical History:  Procedure Laterality Date  . BREAST BIOPSY Right 8/98   stereotactic core bx, epith hyperplasia  . CARPAL TUNNEL RELEASE Bilateral   . CATARACT EXTRACTION    . CESAREAN SECTION    . ENDOMETRIAL ABLATION    . PARS PLANA VITRECTOMY    . REPLACEMENT TOTAL KNEE Left 2016  . ROTATOR CUFF REPAIR Right   . TRIGGER FINGER RELEASE Bilateral   . TRIGGER FINGER RELEASE Left 12/04/2015   Procedure: RELEASE A-1 PULLEY LEFT RING FINGER;  Surgeon: Daryll Brod, MD;  Location: [place];  Service: Orthopedics;  Laterality: Left;  ANESTHESIA: IV REGIONAL UPPER ARM    There were no vitals filed for this visit.  Subjective Assessment - 11/09/18 1901    Subjective  Pt states that she felt bad cancelling her last  appointment due to blood sugar issues. She feels that her Lt buttock region is much improved after the injection. The Lt side of her knee/calf is resolved after the dry needling, but she notices the back of her calf is a little sore.    Pertinent History  DM, Lt TKR, GERD     Currently in Pain?  No/denies                       [facility] Adult PT Treatment/Exercise - 11/09/18 0001      Lumbar Exercises: Standing   Heel Raises  20 reps      Lumbar Exercises: Seated   Other Seated Lumbar Exercises  clamshell with green TB single leg x15 reps     Other Seated Lumbar Exercises  seated heel raises x15 reps; anterior/posterior pelvic tilts with heavy cuing for proper technique x20 reps       Trigger Point Dry Needling - 11/09/18 0001    Consent Given?  Yes    Education Handout Provided  Previously provided    Gastrocnemius Response  Twitch response elicited;Palpable increased muscle length   Lt   Soleus Response  Twitch response elicited;Palpable increased muscle length   Lt  PT Short Term Goals - 11/09/18 1945      PT SHORT TERM GOAL #1   Title  Pt will be independent with her initial HEP to improve body mechanics, flexibility and strength.     Status  Achieved        PT Long Term Goals - 11/09/18 1945      PT LONG TERM GOAL #1   Title  Pt will have atleast 4/5 MMT strength of BLE to improve her efficiency with daily activity.     Time  4    Period  Weeks    Status  On-going      PT LONG TERM GOAL #2   Title  Pt will report being able to complete atleast 1 lap on the path around her home without the need for a rest break.     Time  4    Period  Weeks    Status  New      PT LONG TERM GOAL #3   Title  Pt will report atleast 40% improvement in her pain/symptoms from the start of PT which will increase her activity participation at home and in the community.     Baseline  50% or better    Time  4    Status  New      PT LONG TERM GOAL #4    Title  Pt will be able to demonstrate proper log roll technique with transitioning in/out of bed, in order to decrease pain and strain on her low back.     Time  4    Period  Weeks    Status  Achieved            Plan - 11/09/18 1940    Clinical Impression Statement  Pt arrived with reports of resolved Lt lateral knee/calf pain following dry needling. She also notes improvements in Lt buttock pain following her injection. Overall, she is doing well. Pt requested dry needling of the Lt gastroc this visit, and there were several twitch responses noted. Pt attempted to complete seated anterior and posterior pelvic tilts, however this was very difficult despite therapist tactile/verbal cuing. Will continue to progress LE strength and lumbo-pelvic mobility/control moving forward.    Personal Factors and Comorbidities  Age;Comorbidity 3+;Sex;Fitness    Comorbidities  n    Examination-Activity Limitations  Other    Examination-Participation Restrictions  Yard Work;Laundry;Cleaning    Rehab Potential  Good    PT Frequency  2x / week    PT Duration  6 weeks    PT Treatment/Interventions  ADLs/Self Care Home Management;Electrical Stimulation;Therapeutic activities;Therapeutic exercise;Patient/family education;Neuromuscular re-education;Dry needling    PT Next Visit Plan  re-eval    PT Home Exercise Plan   Access Code: [email or web address]        Patient will benefit from skilled therapeutic intervention in order to improve the following deficits and impairments:  Decreased endurance, Decreased mobility, Increased muscle spasms, Obesity, Improper body mechanics, Decreased range of motion, Decreased safety awareness, Decreased activity tolerance, Decreased strength, Postural dysfunction, Pain, Impaired flexibility  Visit Diagnosis: 1. Pain in left lower leg   2. Muscle weakness (generalized)   3. Chronic left-sided low back pain with left-sided sciatica        Problem List Patient Active Problem List    Diagnosis Date Noted  . Lumbar radiculopathy 09/09/2018  . Chronic bilateral low back pain with bilateral sciatica 09/09/2018  . Type 1 diabetes ([place]) 06/05/2017  . Renal insufficiency 06/05/2017  .  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 Pain of left hand 11/14/2015  . Degenerative arthritis of finger 11/14/2015  . Triggering of digit 11/14/2015  . 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 Proliferative diabetic retinopathy ([place]) 05/14/2015  . Macular puckering of retina, bilateral 05/14/2015  . Asymmetrical left sensorineural hearing loss 09/12/2014  . Brow ptosis 01/16/2014  . Dermatochalasis of eyelid 01/16/2014  . Pseudoaphakia 04/14/2013  . Primary open angle glaucoma 02/10/2013   7:47 PM,11/09/18 Sherol Dade PT, DPT [place] [facility] [address], [place], [place], [address] Phone: [phone]   Fax:  [phone]  Name: TRELLA THURMOND MRN: [id number] Date of Birth: 11-26-1945

## 2018-11-09 NOTE — Patient Instructions (Signed)
Access Code: [email or web address]  URL: [email or web address]  Date: 11/09/2018  Prepared by: Sherol Dade   Exercises  Seated Piriformis Stretch - 3 reps - 1 sets - 30 hold - 1x daily - 7x weekly  Clamshell - 10 reps - 1 sets - 1x daily - 7x weekly  Supine Bridge - 10 reps - 1 sets - 1x daily - 7x weekly  Heel Raise - 10 reps - 3 sets - 1x daily - 7x weekly  Seated Pelvic Tilt - 10 reps - 3 sets - 1x daily - 7x weekly  Seated Ankle Eversion with Resistance - 10 reps - 2 sets - 1x daily - 7x weekly    [facility] [address] [place],  [address] Phone # [phone] Fax [phone]

## 2018-11-11 ENCOUNTER — Other Ambulatory Visit: Payer: Self-pay

## 2018-11-11 ENCOUNTER — Ambulatory Visit: Payer: Medicare Other | Admitting: Physical Therapy

## 2018-11-11 ENCOUNTER — Encounter: Payer: Self-pay | Admitting: Physical Therapy

## 2018-11-11 DIAGNOSIS — M79662 Pain in left lower leg: Secondary | ICD-10-CM | POA: Diagnosis not present

## 2018-11-11 DIAGNOSIS — M5442 Lumbago with sciatica, left side: Secondary | ICD-10-CM

## 2018-11-11 DIAGNOSIS — G8929 Other chronic pain: Secondary | ICD-10-CM | POA: Diagnosis not present

## 2018-11-11 DIAGNOSIS — M6281 Muscle weakness (generalized): Secondary | ICD-10-CM | POA: Diagnosis not present

## 2018-11-11 NOTE — Patient Instructions (Signed)
Access Code: [email or web address]  URL: [email or web address]  Date: 11/11/2018  Prepared by: Ruben Im   Exercises  Seated Piriformis Stretch - 3 reps - 1 sets - 30 hold - 1x daily - 7x weekly  Clamshell - 10 reps - 1 sets - 1x daily - 7x weekly  Supine Bridge - 10 reps - 1 sets - 1x daily - 7x weekly  Heel Raise - 10 reps - 3 sets - 1x daily - 7x weekly  Seated Pelvic Tilt - 10 reps - 3 sets - 1x daily - 7x weekly  Seated Ankle Eversion with Resistance - 10 reps - 2 sets - 1x daily - 7x weekly  Sit to Stand - 10 reps - 1 sets - 1x daily - 7x weekly  Standing Hip Abduction - 10 reps - 1 sets - 1x daily - 7x weekly  Standing Ankle Dorsiflexion Stretch - 3 reps - 1 sets - 30 hold - 1x daily - 7x weekly    [facility] [address] [place], [place] [address] Phone # [phone] Fax [phone]

## 2018-11-11 NOTE — Therapy (Signed)
[facility] [address] [place], [place], [address] Phone: [phone]   Fax:  [phone]  Physical Therapy Treatment/Recertification   Patient Details  Name: Jasmin Simmons MRN: [id number] Date of Birth: 1945-12-29 Referring Provider (PT): Joni Fears, MD    Encounter Date: 11/11/2018  PT End of Session - 11/11/18 1618    Visit Number  6    Date for PT Re-Evaluation  12/23/18    Authorization Type  Medicare A and B    PT Start Time  8185    PT Stop Time  1620    PT Time Calculation (min)  45 min    Activity Tolerance  Patient tolerated treatment well       Past Medical History:  Diagnosis Date  . Anemia   . Anxiety   . Cancer [facility])    breast cancer, stage 0  (radiation)  . Depression   . Diabetes mellitus without complication (HCC)    Type I  . Fibroid   . Gastroparesis    & rectal dysfunction  . GERD (gastroesophageal reflux disease)   . Hypertension   . Hypothyroidism   . PTSD (post-traumatic stress disorder)    secondary to eye problems  . Small intestinal bacterial overgrowth   . Thyroid disease    hypothyroidism    Past Surgical History:  Procedure Laterality Date  . BREAST BIOPSY Right 8/98   stereotactic core bx, epith hyperplasia  . CARPAL TUNNEL RELEASE Bilateral   . CATARACT EXTRACTION    . CESAREAN SECTION    . ENDOMETRIAL ABLATION    . PARS PLANA VITRECTOMY    . REPLACEMENT TOTAL KNEE Left 2016  . ROTATOR CUFF REPAIR Right   . TRIGGER FINGER RELEASE Bilateral   . TRIGGER FINGER RELEASE Left 12/04/2015   Procedure: RELEASE A-1 PULLEY LEFT RING FINGER;  Surgeon: Daryll Brod, MD;  Location: [place];  Service: Orthopedics;  Laterality: Left;  ANESTHESIA: IV REGIONAL UPPER ARM    There were no vitals filed for this visit.  Subjective Assessment - 11/11/18 1535    Subjective  My blood sugar was 90 when I left.  I have been having muscle spasms in both legs.   I've been taking magnesium.  The kidney doctor said that's common with stage 4.  My big toe on the left is numb.  The DN helped my lower leg.  The hip feels better too.  Overall 60% better since start of care.    Pertinent History  DM, Lt TKR, GERD     Patient Stated Goals  exercises to complete at home so that she can avoid surgery         [facility] PT Assessment - 11/11/18 0001      Assessment   Medical Diagnosis  Lumbar radiculpathy    Referring Provider (PT)  Joni Fears, MD       Observation/Other Assessments   Focus on Therapeutic Outcomes (FOTO)   31% limitation      AROM   Lumbar Extension  25% limited, no pain     Lumbar - Right Side [facility]      Strength   Right Hip Flexion  4+/5    Right Hip Extension  3+/5    Right Hip ABduction  3+/5    Left Hip Flexion  4+/5    Left Hip Extension  3+/5    Left Hip ABduction  3+/5    Left Ankle Dorsiflexion  4+/5      Ambulation/Gait   Gait Comments  Pt ambulating with decreased push off on Lt, Noted foot held rigid and slightly inverted                    [facility] Adult PT Treatment/Exercise - 11/11/18 0001      Transfers   Five time sit to stand comments   17 sec no UE   no pain      Lumbar Exercises: Stretches   Gastroc Stretch  Left;3 reps;30 seconds    Gastroc Stretch Limitations  wall       Lumbar Exercises: Standing   Other Standing Lumbar Exercises  hip abduction at counter 10x right/left       Lumbar Exercises: Seated   Sit to Stand  10 reps      Lumbar Exercises: Sidelying   Clam  Right;Left;10 reps             PT Education - 11/11/18 1627    Education Details  Access Code: [email or web address]   sit to stand;  stand calf stretch wall;  standing hip abduction at counter    Person(s) Educated  Patient    Methods  Explanation;Demonstration;Handout    Comprehension  Returned demonstration;Verbalized understanding       PT Short Term Goals - 11/11/18 1541      PT SHORT TERM GOAL #1   Title  Pt  will be independent with her initial HEP to improve body mechanics, flexibility and strength.     Status  Achieved        PT Long Term Goals - 11/11/18 1554      PT LONG TERM GOAL #1   Title  Pt will have atleast 4/5 MMT strength of BLE to improve her efficiency with daily activity.     Time  4    Period  Weeks    Status  Partially Met    Target Date  12/23/18      PT LONG TERM GOAL #2   Title  Pt will report being able to complete atleast 1 lap on the path around her home without the need for a rest break.     Time  6    Period  Weeks    Status  On-going      PT LONG TERM GOAL #3   Title  Pt will report atleast 70% improvement in her pain/symptoms from the start of PT which will increase her activity participation at home and in the community.    Time  6    Period  Weeks    Status  Revised      PT LONG TERM GOAL #4   Title  Pt will be able to demonstrate proper log roll technique with transitioning in/out of bed, in order to decrease pain and strain on her low back.       PT LONG TERM GOAL #5   Title  Improved LE strength in hip and knee to perform 5x sit to stand in 13 sec or less    Time  6    Period  Weeks    Status  New      Additional Long Term Goals   Additional Long Term Goals  Yes      PT LONG TERM GOAL #6   Title  The patient will demonstrate understanding of a safe self progression of a HEP for further improvements in strength and function    Time  6  Period  Weeks    Status  New            Plan - 11/11/18 1559    Clinical Impression Statement  The patient has made good improvements in pain reduction and walking distance since the start of care and rates her overall improvement at 60%.  She reports dry needling has helped significantly with posterior hip pain as well as lower leg pain.  Her strength has improved by 1/2 a manual muscle test grade in hip, knee and ankles although she would benefit from further strengthening to increase her ability to  walk functional distances in the community and decrease her risk of falls.  She has met partial LTGs but she has many co-morbidities that slow her progress and she is somewhat fearful of her ability to progress the exercises on her own and of the pain returning.  Recommend continued PT to address remaining deficits and establish a HEP at a reduced frequency of 1x/week for up to 6 more weeks as needed.    Personal Factors and Comorbidities  Age;Comorbidity 3+;Sex;Fitness    Examination-Activity Limitations  Other    Examination-Participation Restrictions  Yard Work;Laundry;Cleaning    Rehab Potential  Good    PT Frequency  1x / week    PT Duration  6 weeks    PT Treatment/Interventions  ADLs/Self Care Home Management;Electrical Stimulation;Therapeutic activities;Therapeutic exercise;Patient/family education;Neuromuscular re-education;Dry needling    PT Next Visit Plan  review HEP and progress as able;  DN as needed;  1x/week    PT Home Exercise Plan   Access Code: [email or web address]        Patient will benefit from skilled therapeutic intervention in order to improve the following deficits and impairments:  Decreased endurance, Decreased mobility, Increased muscle spasms, Obesity, Improper body mechanics, Decreased range of motion, Decreased safety awareness, Decreased activity tolerance, Decreased strength, Postural dysfunction, Pain, Impaired flexibility  Visit Diagnosis: 1. Pain in left lower leg   2. Muscle weakness (generalized)   3. Chronic left-sided low back pain with left-sided sciatica        Problem List Patient Active Problem List   Diagnosis Date Noted  . Lumbar radiculopathy 09/09/2018  . Chronic bilateral low back pain with bilateral sciatica 09/09/2018  . Type 1 diabetes ([place]) 06/05/2017  . Renal insufficiency 06/05/2017  . 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 Pain of left hand 11/14/2015  . Degenerative arthritis of finger 11/14/2015  . Triggering of  digit 11/14/2015  . 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 Proliferative diabetic retinopathy ([place]) 05/14/2015  . Macular puckering of retina, bilateral 05/14/2015  . Asymmetrical left sensorineural hearing loss 09/12/2014  . Brow ptosis 01/16/2014  . Dermatochalasis of eyelid 01/16/2014  . Pseudoaphakia 04/14/2013  . Primary open angle glaucoma 02/10/2013   Ruben Im, PT 11/11/18 4:44 PM Phone: [phone] Fax: [phone] Alvera Singh 11/11/2018, 4:43 PM  [facility] [address], [place], [place], [address] Phone: [phone]   Fax:  [phone]  Name: Jasmin Simmons MRN: [id number] Date of Birth: 01-30-1946

## 2018-11-15 ENCOUNTER — Ambulatory Visit: Payer: Medicare Other | Admitting: Physical Therapy

## 2018-11-15 ENCOUNTER — Encounter: Payer: Self-pay | Admitting: Physical Therapy

## 2018-11-15 ENCOUNTER — Other Ambulatory Visit: Payer: Self-pay | Admitting: Obstetrics & Gynecology

## 2018-11-15 ENCOUNTER — Other Ambulatory Visit: Payer: Self-pay

## 2018-11-15 DIAGNOSIS — G8929 Other chronic pain: Secondary | ICD-10-CM | POA: Diagnosis not present

## 2018-11-15 DIAGNOSIS — M79662 Pain in left lower leg: Secondary | ICD-10-CM | POA: Diagnosis not present

## 2018-11-15 DIAGNOSIS — M6281 Muscle weakness (generalized): Secondary | ICD-10-CM | POA: Diagnosis not present

## 2018-11-15 DIAGNOSIS — M5442 Lumbago with sciatica, left side: Secondary | ICD-10-CM | POA: Diagnosis not present

## 2018-11-15 NOTE — Patient Instructions (Signed)
Access Code: [email or web address]  URL: [email or web address]  Date: 11/15/2018  Prepared by: Ruben Im   Exercises  Seated Piriformis Stretch - 3 reps - 1 sets - 30 hold - 1x daily - 7x weekly  Clamshell - 10 reps - 1 sets - 1x daily - 7x weekly  Supine Bridge - 10 reps - 1 sets - 1x daily - 7x weekly  Heel Raise - 10 reps - 3 sets - 1x daily - 7x weekly  Seated Pelvic Tilt - 10 reps - 3 sets - 1x daily - 7x weekly  Seated Ankle Eversion with Resistance - 10 reps - 2 sets - 1x daily - 7x weekly  Sit to Stand - 10 reps - 1 sets - 1x daily - 7x weekly  Standing Hip Abduction - 10 reps - 1 sets - 1x daily - 7x weekly  Standing Ankle Dorsiflexion Stretch - 3 reps - 1 sets - 30 hold - 1x daily - 7x weekly  Seated Slump Nerve Glide - 10 reps - 1 sets - 2x daily - 7x weekly

## 2018-11-15 NOTE — Therapy (Signed)
[facility] [address], [place], [place], [address] Phone: [phone]   Fax:  [phone]  Physical Therapy Treatment  Patient Details  Name: Jasmin Simmons MRN: [id number] Date of Birth: April 13, 1946 Referring Provider (PT): Joni Fears, MD    Encounter Date: 11/15/2018  PT End of Session - 11/15/18 1033    Visit Number  7    Date for PT Re-Evaluation  12/23/18    Authorization Type  Medicare A and B    PT Start Time  [phone]    PT Stop Time  1020    PT Time Calculation (min)  42 min    Activity Tolerance  Patient tolerated treatment well       Past Medical History:  Diagnosis Date  . Anemia   . Anxiety   . Cancer [facility])    breast cancer, stage 0  (radiation)  . Depression   . Diabetes mellitus without complication (HCC)    Type I  . Fibroid   . Gastroparesis    & rectal dysfunction  . GERD (gastroesophageal reflux disease)   . Hypertension   . Hypothyroidism   . PTSD (post-traumatic stress disorder)    secondary to eye problems  . Small intestinal bacterial overgrowth   . Thyroid disease    hypothyroidism    Past Surgical History:  Procedure Laterality Date  . BREAST BIOPSY Right 8/98   stereotactic core bx, epith hyperplasia  . CARPAL TUNNEL RELEASE Bilateral   . CATARACT EXTRACTION    . CESAREAN SECTION    . ENDOMETRIAL ABLATION    . PARS PLANA VITRECTOMY    . REPLACEMENT TOTAL KNEE Left 2016  . ROTATOR CUFF REPAIR Right   . TRIGGER FINGER RELEASE Bilateral   . TRIGGER FINGER RELEASE Left 12/04/2015   Procedure: RELEASE A-1 PULLEY LEFT RING FINGER;  Surgeon: Daryll Brod, MD;  Location: [place];  Service: Orthopedics;  Laterality: Left;  ANESTHESIA: IV REGIONAL UPPER ARM    There were no vitals filed for this visit.  Subjective Assessment - 11/15/18 0939    Subjective  Patient late. Family making arrangements for a visit today for the first time since Jan.    "I had a  horrible weekend.  My left lower leg quivers and throbs.  It went up in my hip some but my leg bothered me more."  I think the needling helps.    Pertinent History  DM, Lt TKR, GERD     Currently in Pain?  No/denies    Pain Score  0-No pain    Aggravating Factors   being up/active                       [facility] Adult PT Treatment/Exercise - 11/15/18 0001      Lumbar Exercises: Stretches   Gastroc Stretch  Left;3 reps;30 seconds    Gastroc Stretch Limitations  wall       Lumbar Exercises: Aerobic   Nustep  L1 seat 8, arms 5 minutes       Lumbar Exercises: Standing   Other Standing Lumbar Exercises  review of comprehensive HEP       Lumbar Exercises: Seated   Sit to Stand  10 reps   no hands   Other Seated Lumbar Exercises  seated neural floss 10x right/left     Other Seated Lumbar Exercises  yellow band ankle eversion 15x       Moist Heat Therapy  Number Minutes Moist Heat  5 Minutes    Moist Heat Location  Ankle   left lower leg ;  while discussing HEP     Manual Therapy   Soft tissue mobilization  left peroneals, gastroc and soleus        Trigger Point Dry Needling - 11/15/18 0001    Consent Given?  Yes    Peroneals Response  Twitch response elicited;Palpable increased muscle length    Gastrocnemius Response  Twitch response elicited;Palpable increased muscle length    Soleus Response  Twitch response elicited;Palpable increased muscle length      Left      PT Education - 11/15/18 1024    Education Details  Access Code: [email or web address] neural flossing seated    Person(s) Educated  Patient    Methods  Explanation;Demonstration;Handout    Comprehension  Returned demonstration;Verbalized understanding       PT Short Term Goals - 11/11/18 1541      PT SHORT TERM GOAL #1   Title  Pt will be independent with her initial HEP to improve body mechanics, flexibility and strength.     Status  Achieved        PT Long Term Goals - 11/11/18 1554      PT  LONG TERM GOAL #1   Title  Pt will have atleast 4/5 MMT strength of BLE to improve her efficiency with daily activity.     Time  4    Period  Weeks    Status  Partially Met    Target Date  12/23/18      PT LONG TERM GOAL #2   Title  Pt will report being able to complete atleast 1 lap on the path around her home without the need for a rest break.     Time  6    Period  Weeks    Status  On-going      PT LONG TERM GOAL #3   Title  Pt will report atleast 70% improvement in her pain/symptoms from the start of PT which will increase her activity participation at home and in the community.    Time  6    Period  Weeks    Status  Revised      PT LONG TERM GOAL #4   Title  Pt will be able to demonstrate proper log roll technique with transitioning in/out of bed, in order to decrease pain and strain on her low back.       PT LONG TERM GOAL #5   Title  Improved LE strength in hip and knee to perform 5x sit to stand in 13 sec or less    Time  6    Period  Weeks    Status  New      Additional Long Term Goals   Additional Long Term Goals  Yes      PT LONG TERM GOAL #6   Title  The patient will demonstrate understanding of a safe self progression of a HEP for further improvements in strength and function    Time  6    Period  Weeks    Status  New            Plan - 11/15/18 1017    Clinical Impression Statement  The patient reports intermittent paresthesia in left lower lateral leg during many exercises but dissipates quickly with rest.  Added seated neural flossing to HEP which did not exacerbate symptoms.   She likes the  movement of the Nu-Step which did produce mild paresthesia which quickly resolved.   She has moderate tender points in left peroneal muscles.  Much improved soft tissue length and decreased tender points following dry needling and manual therapy. Overall progress may be slower secondary to numerous co-morbidities.    PT Frequency  1x / week    PT Duration  6 weeks     PT Treatment/Interventions  ADLs/Self Care Home Management;Electrical Stimulation;Therapeutic activities;Therapeutic exercise;Patient/family education;Neuromuscular re-education;Dry needling    PT Next Visit Plan  continue 1x/week for functional strengthening; neural gliding;  DN and manual as needed for hip and lower leg regions    PT Home Exercise Plan   Access Code: [email or web address]        Patient will benefit from skilled therapeutic intervention in order to improve the following deficits and impairments:     Visit Diagnosis: 1. Pain in left lower leg   2. Muscle weakness (generalized)   3. Chronic left-sided low back pain with left-sided sciatica        Problem List Patient Active Problem List   Diagnosis Date Noted  . Lumbar radiculopathy 09/09/2018  . Chronic bilateral low back pain with bilateral sciatica 09/09/2018  . Type 1 diabetes ([place]) 06/05/2017  . Renal insufficiency 06/05/2017  . 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 Pain of left hand 11/14/2015  . Degenerative arthritis of finger 11/14/2015  . Triggering of digit 11/14/2015  . 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 Proliferative diabetic retinopathy ([place]) 05/14/2015  . Macular puckering of retina, bilateral 05/14/2015  . Asymmetrical left sensorineural hearing loss 09/12/2014  . Brow ptosis 01/16/2014  . Dermatochalasis of eyelid 01/16/2014  . Pseudoaphakia 04/14/2013  . Primary open angle glaucoma 02/10/2013   Ruben Im, PT 11/15/18 11:25 AM Phone: [phone] Fax: [phone] Alvera Singh 11/15/2018, 11:24 AM  [facility] [address] [place], [place], [address] Phone: [phone]   Fax:  [phone]  Name: Jasmin Simmons MRN: [id number] Date of Birth: [birth year]

## 2018-11-15 NOTE — Telephone Encounter (Signed)
Medication refill request:Microbid 100mg   Last AEX:  06/05/17 Next AEX: Nothing Scheduled  Last MMG (if hormonal medication request): 12/29/17 Bi-rads 2 benign  Refill authorized: #30 with 2 RF please advise.

## 2018-11-16 NOTE — Progress Notes (Signed)
Jasmin Simmons - 73 y.o. female MRN [id number]  Date of birth: 04/25/1946  Office Visit Note: Visit Date: 11/01/2018 PCP: Haywood Pao, MD Referred by: Haywood Pao, MD  Subjective: Chief Complaint  Patient presents with  . Lower Back - Pain   HPI:  Jasmin Simmons is a 73 y.o. female who comes in today At the request of Dr. Joni Fears for left L5 transforaminal epidural steroid injection.  Patient's been having chronic worsening severe low back and left radicular leg pain for several months now and she is really at this point having to use oxycodone and she is in physical therapy and she is just had no relief in her pain is quite severe.  She rates this as an 8 out of 10.  We have seen her in the remote past maybe 4 years ago and completed some injections that did help her.  New MRI was completed and is reviewed below.  She does have lateral recess narrowing at L4-5 which could be the source of her pain.  Depending on relief would repeat injection.  If she gets diagnostic relief but is not lasting very long then she might be a surgical candidate for decompression.  ROS Otherwise per HPI.  Assessment & Plan: Visit Diagnoses:  1. Lumbar radiculopathy     Plan: No additional findings.   Meds & Orders:  Meds ordered this encounter  Medications  . betamethasone acetate-betamethasone sodium phosphate (CELESTONE) injection 12 mg    Orders Placed This Encounter  Procedures  . XR C-ARM NO REPORT  . Epidural Steroid injection    Follow-up: No follow-ups on file.   Procedures: No procedures performed  Lumbosacral Transforaminal Epidural Steroid Injection - Sub-Pedicular Approach with Fluoroscopic Guidance  Patient: Jasmin Simmons      Date of Birth: November 05, 1945 MRN: [id number] PCP: Haywood Pao, MD      Visit Date: 11/01/2018   Universal Protocol:    Date/Time: 11/01/2018  Consent Given By: the patient  Position: PRONE  Additional Comments: Vital signs  were monitored before and after the procedure. Patient was prepped and draped in the usual sterile fashion. The correct patient, procedure, and site was verified.   Injection Procedure Details:  Procedure Site One Meds Administered:  Meds ordered this encounter  Medications  . betamethasone acetate-betamethasone sodium phosphate (CELESTONE) injection 12 mg    Laterality: Left  Location/Site:  L5-S1  Needle size: 22 G  Needle type: Spinal  Needle Placement: Transforaminal  Findings:    -Comments: Excellent flow of contrast along the nerve and into the epidural space.  Procedure Details: After squaring off the end-plates to get a true AP view, the C-arm was positioned so that an oblique view of the foramen as noted above was visualized. The target area is just inferior to the "nose of the scotty dog" or sub pedicular. The soft tissues overlying this structure were infiltrated with 2-3 ml. of 1% Lidocaine without Epinephrine.  The spinal needle was inserted toward the target using a "trajectory" view along the fluoroscope beam.  Under AP and lateral visualization, the needle was advanced so it did not puncture dura and was located close the 6 O'Clock position of the pedical in AP tracterory. Biplanar projections were used to confirm position. Aspiration was confirmed to be negative for CSF and/or blood. A 1-2 ml. volume of Isovue-250 was injected and flow of contrast was noted at each level. Radiographs were obtained for documentation purposes.   After  attaining the desired flow of contrast documented above, a 0.5 to 1.0 ml test dose of 0.25% Marcaine was injected into each respective transforaminal space.  The patient was observed for 90 seconds post injection.  After no sensory deficits were reported, and normal lower extremity motor function was noted,   the above injectate was administered so that equal amounts of the injectate were placed at each foramen (level) into the  transforaminal epidural space.   Additional Comments:  The patient tolerated the procedure well Dressing: 2 x 2 sterile gauze and Band-Aid    Post-procedure details: Patient was observed during the procedure. Post-procedure instructions were reviewed.  Patient left the clinic in stable condition.    Clinical History: MRI LUMBAR SPINE WITHOUT CONTRAST  TECHNIQUE: Multiplanar, multisequence MR imaging of the lumbar spine was performed. No intravenous contrast was administered.  COMPARISON:  Lumbar spine x-rays dated September 09, 2018.  FINDINGS: Segmentation:  Standard.  Alignment:  2 mm anterolisthesis at L3-L4.  Vertebrae:  No fracture, evidence of discitis, or bone lesion.  Conus medullaris and cauda equina: Conus extends to the L1 level. Conus and cauda equina appear normal.  Paraspinal and other soft tissues: Negative.  Disc levels:  T12-L1:  Negative.  L1-L2: Negative disc. Mild left ligamentum flavum hypertrophy. No stenosis.  L2-L3: Tiny biforaminal disc protrusions. Mild bilateral facet arthropathy with ligamentum flavum hypertrophy and tiny 2 mm left synovial cyst. No stenosis.  L3-L4: Mild disc bulging. Moderate to severe bilateral facet arthropathy. 1.1 cm synovial cyst in the posterior paraspinous soft tissues arising from the right facet joint. Moderate to severe spinal canal stenosis. Mild to moderate left and mild right lateral recess stenosis. Mild bilateral neuroforaminal stenosis.  L4-L5: Mild disc bulging with superimposed central and left subarticular disc protrusion. Mild bilateral facet arthropathy. Severe left and moderate to severe right lateral recess stenosis. Mild-to-moderate spinal canal stenosis. Mild right neuroforaminal stenosis. No left neuroforaminal stenosis.  L5-S1: Central and right paracentral disc protrusion with annular fissure. Mild bilateral facet arthropathy. Mild right lateral recess stenosis. No spinal  canal or neuroforaminal stenosis.  IMPRESSION: 1. Multilevel lumbar spondylosis as described above. Symptomatic level likely L4-L5 where there is severe left lateral recess stenosis affecting the descending left L5 nerve root. Additional moderate to severe right lateral recess stenosis at this level could affect the descending right L5 nerve root. 2. Additional moderate to severe spinal canal stenosis and mild-to-moderate left neuroforaminal stenosis at L3-L4.   Electronically Signed   By: Titus Dubin M.D.   On: 09/17/2018 16:43     Objective:  VS:  HT:    WT:   BMI:     BP:(!) 146/65  HR:69bpm  TEMP:98 F (36.7 C)( )  RESP:100 % Physical Exam  Ortho Exam Imaging: No results found.

## 2018-11-16 NOTE — Procedures (Signed)
Lumbosacral Transforaminal Epidural Steroid Injection - Sub-Pedicular Approach with Fluoroscopic Guidance  Patient: Jasmin Simmons      Date of Birth: [birth year] MRN: [id number] PCP: Haywood Pao, MD      Visit Date: 11/01/2018   Universal Protocol:    Date/Time: 11/01/2018  Consent Given By: the patient  Position: PRONE  Additional Comments: Vital signs were monitored before and after the procedure. Patient was prepped and draped in the usual sterile fashion. The correct patient, procedure, and site was verified.   Injection Procedure Details:  Procedure Site One Meds Administered:  Meds ordered this encounter  Medications  . betamethasone acetate-betamethasone sodium phosphate (CELESTONE) injection 12 mg    Laterality: Left  Location/Site:  L5-S1  Needle size: 22 G  Needle type: Spinal  Needle Placement: Transforaminal  Findings:    -Comments: Excellent flow of contrast along the nerve and into the epidural space.  Procedure Details: After squaring off the end-plates to get a true AP view, the C-arm was positioned so that an oblique view of the foramen as noted above was visualized. The target area is just inferior to the "nose of the scotty dog" or sub pedicular. The soft tissues overlying this structure were infiltrated with 2-3 ml. of 1% Lidocaine without Epinephrine.  The spinal needle was inserted toward the target using a "trajectory" view along the fluoroscope beam.  Under AP and lateral visualization, the needle was advanced so it did not puncture dura and was located close the 6 O'Clock position of the pedical in AP tracterory. Biplanar projections were used to confirm position. Aspiration was confirmed to be negative for CSF and/or blood. A 1-2 ml. volume of Isovue-250 was injected and flow of contrast was noted at each level. Radiographs were obtained for documentation purposes.   After attaining the desired flow of contrast documented above, a 0.5 to  1.0 ml test dose of 0.25% Marcaine was injected into each respective transforaminal space.  The patient was observed for 90 seconds post injection.  After no sensory deficits were reported, and normal lower extremity motor function was noted,   the above injectate was administered so that equal amounts of the injectate were placed at each foramen (level) into the transforaminal epidural space.   Additional Comments:  The patient tolerated the procedure well Dressing: 2 x 2 sterile gauze and Band-Aid    Post-procedure details: Patient was observed during the procedure. Post-procedure instructions were reviewed.  Patient left the clinic in stable condition.

## 2018-11-19 DIAGNOSIS — E039 Hypothyroidism, unspecified: Secondary | ICD-10-CM | POA: Diagnosis not present

## 2018-11-19 DIAGNOSIS — E103593 Type 1 diabetes mellitus with proliferative diabetic retinopathy without macular edema, bilateral: Secondary | ICD-10-CM | POA: Diagnosis not present

## 2018-11-19 DIAGNOSIS — K3184 Gastroparesis: Secondary | ICD-10-CM | POA: Diagnosis not present

## 2018-11-19 DIAGNOSIS — N2581 Secondary hyperparathyroidism of renal origin: Secondary | ICD-10-CM | POA: Diagnosis not present

## 2018-11-19 DIAGNOSIS — Z794 Long term (current) use of insulin: Secondary | ICD-10-CM | POA: Diagnosis not present

## 2018-11-19 DIAGNOSIS — Z4681 Encounter for fitting and adjustment of insulin pump: Secondary | ICD-10-CM | POA: Diagnosis not present

## 2018-11-19 DIAGNOSIS — N184 Chronic kidney disease, stage 4 (severe): Secondary | ICD-10-CM | POA: Diagnosis not present

## 2018-11-19 DIAGNOSIS — E7800 Pure hypercholesterolemia, unspecified: Secondary | ICD-10-CM | POA: Diagnosis not present

## 2018-11-19 DIAGNOSIS — D631 Anemia in chronic kidney disease: Secondary | ICD-10-CM | POA: Diagnosis not present

## 2018-11-19 DIAGNOSIS — K5909 Other constipation: Secondary | ICD-10-CM | POA: Diagnosis not present

## 2018-11-22 DIAGNOSIS — E7800 Pure hypercholesterolemia, unspecified: Secondary | ICD-10-CM | POA: Diagnosis not present

## 2018-11-22 DIAGNOSIS — E103593 Type 1 diabetes mellitus with proliferative diabetic retinopathy without macular edema, bilateral: Secondary | ICD-10-CM | POA: Diagnosis not present

## 2018-11-22 DIAGNOSIS — I10 Essential (primary) hypertension: Secondary | ICD-10-CM | POA: Diagnosis not present

## 2018-11-22 DIAGNOSIS — N2581 Secondary hyperparathyroidism of renal origin: Secondary | ICD-10-CM | POA: Diagnosis not present

## 2018-11-23 ENCOUNTER — Encounter: Payer: Self-pay | Admitting: Physical Therapy

## 2018-11-23 ENCOUNTER — Other Ambulatory Visit: Payer: Self-pay

## 2018-11-23 ENCOUNTER — Ambulatory Visit: Payer: Medicare Other | Admitting: Physical Therapy

## 2018-11-23 DIAGNOSIS — M79662 Pain in left lower leg: Secondary | ICD-10-CM | POA: Diagnosis not present

## 2018-11-23 DIAGNOSIS — M5442 Lumbago with sciatica, left side: Secondary | ICD-10-CM | POA: Diagnosis not present

## 2018-11-23 DIAGNOSIS — R82998 Other abnormal findings in urine: Secondary | ICD-10-CM | POA: Diagnosis not present

## 2018-11-23 DIAGNOSIS — M6281 Muscle weakness (generalized): Secondary | ICD-10-CM | POA: Diagnosis not present

## 2018-11-23 DIAGNOSIS — G8929 Other chronic pain: Secondary | ICD-10-CM

## 2018-11-23 DIAGNOSIS — I10 Essential (primary) hypertension: Secondary | ICD-10-CM | POA: Diagnosis not present

## 2018-11-23 NOTE — Therapy (Signed)
[facility] [address], [place], [place], [address] Phone: [phone]   Fax:  [phone]  Physical Therapy Treatment  Patient Details  Name: Jasmin Simmons MRN: [id number] Date of Birth: Nov 04, 1945 Referring Provider (PT): Joni Fears, MD    Encounter Date: 11/23/2018  PT End of Session - 11/23/18 1608    Visit Number  8    Date for PT Re-Evaluation  12/23/18    Authorization Type  Medicare A and B    PT Start Time  1602    PT Stop Time  1645    PT Time Calculation (min)  43 min    Activity Tolerance  Patient tolerated treatment well    Behavior During Therapy  [facility] for tasks assessed/performed       Past Medical History:  Diagnosis Date  . Anemia   . Anxiety   . Cancer [facility])    breast cancer, stage 0  (radiation)  . Depression   . Diabetes mellitus without complication (HCC)    Type I  . Fibroid   . Gastroparesis    & rectal dysfunction  . GERD (gastroesophageal reflux disease)   . Hypertension   . Hypothyroidism   . PTSD (post-traumatic stress disorder)    secondary to eye problems  . Small intestinal bacterial overgrowth   . Thyroid disease    hypothyroidism    Past Surgical History:  Procedure Laterality Date  . BREAST BIOPSY Right 8/98   stereotactic core bx, epith hyperplasia  . CARPAL TUNNEL RELEASE Bilateral   . CATARACT EXTRACTION    . CESAREAN SECTION    . ENDOMETRIAL ABLATION    . PARS PLANA VITRECTOMY    . REPLACEMENT TOTAL KNEE Left 2016  . ROTATOR CUFF REPAIR Right   . TRIGGER FINGER RELEASE Bilateral   . TRIGGER FINGER RELEASE Left 12/04/2015   Procedure: RELEASE A-1 PULLEY LEFT RING FINGER;  Surgeon: Daryll Brod, MD;  Location: [place];  Service: Orthopedics;  Laterality: Left;  ANESTHESIA: IV REGIONAL UPPER ARM    There were no vitals filed for this visit.  Subjective Assessment - 11/23/18 1704    Subjective  Pt states she was busy and unable to work  on her HEP as much as she would've liked. She notices pain along the outside of the lower leg and top of the foot when doing alot of work around the house. Just tightness noted right now.    Pertinent History  DM, Lt TKR, GERD     Currently in Pain?  No/denies                       [facility] Adult PT Treatment/Exercise - 11/23/18 0001      Exercises   Other Exercises   Lt ankle eversion/inversion x10 reps; plantar flexion with yellow TB x20 reps      Lumbar Exercises: Stretches   Gastroc Stretch  Left;2 reps;30 seconds    Gastroc Stretch Limitations  wall       Lumbar Exercises: Aerobic   Stationary Bike  L2 x5 min      Lumbar Exercises: Standing   Heel Raises  20 reps    Heel Raises Limitations  focus on weight shift towards great toe       Lumbar Exercises: Supine   Other Supine Lumbar Exercises  Sciatic nerve flossing with femoral nerve component x10 reps (therapist assistance)       Lumbar Exercises: Sidelying  Clam  10 reps;Left   x2 sets    Clam Limitations  pt cued to avoid ankle inversion compensation      Manual Therapy   Joint Mobilization  Proximal fibular mobilization AP and PA x3 bouts              PT Education - 11/23/18 1658    Education Details  updated HEP and discussed importance of compliance    Person(s) Educated  Patient    Methods  Explanation;Handout;Verbal cues    Comprehension  Verbalized understanding;Returned demonstration       PT Short Term Goals - 11/11/18 1541      PT SHORT TERM GOAL #1   Title  Pt will be independent with her initial HEP to improve body mechanics, flexibility and strength.     Status  Achieved        PT Long Term Goals - 11/11/18 1554      PT LONG TERM GOAL #1   Title  Pt will have atleast 4/5 MMT strength of BLE to improve her efficiency with daily activity.     Time  4    Period  Weeks    Status  Partially Met    Target Date  12/23/18      PT LONG TERM GOAL #2   Title  Pt will report  being able to complete atleast 1 lap on the path around her home without the need for a rest break.     Time  6    Period  Weeks    Status  On-going      PT LONG TERM GOAL #3   Title  Pt will report atleast 70% improvement in her pain/symptoms from the start of PT which will increase her activity participation at home and in the community.    Time  6    Period  Weeks    Status  Revised      PT LONG TERM GOAL #4   Title  Pt will be able to demonstrate proper log roll technique with transitioning in/out of bed, in order to decrease pain and strain on her low back.       PT LONG TERM GOAL #5   Title  Improved LE strength in hip and knee to perform 5x sit to stand in 13 sec or less    Time  6    Period  Weeks    Status  New      Additional Long Term Goals   Additional Long Term Goals  Yes      PT LONG TERM GOAL #6   Title  The patient will demonstrate understanding of a safe self progression of a HEP for further improvements in strength and function    Time  6    Period  Weeks    Status  New            Plan - 11/23/18 1659    Clinical Impression Statement  Pt continues to report pain along proximal LE and top of the foot when completing alot of activity around her home. Pt has not been as compliant with her HEP and therapist discussed the importance of this. Pt has limitations in ankle flexibility and strength, and her HEP was reviewed to address this. Pt did note tingling in the foot following nerve flossing, but this improved after a couple of seconds. End of session, pt felt that the outside of her knee and shin were less tight than when  she arrived. Will continue to encourage HEP adherence moving forward.    PT Frequency  1x / week    PT Duration  6 weeks    PT Treatment/Interventions  ADLs/Self Care Home Management;Electrical Stimulation;Therapeutic activities;Therapeutic exercise;Patient/family education;Neuromuscular re-education;Dry needling    PT Next Visit Plan  f/u on  HEP and symptoms in lower leg; neural gliding; ankle strengthening; DN and manual as needed for hip and lower leg regions    PT Home Exercise Plan   Access Code: [email or web address]        Patient will benefit from skilled therapeutic intervention in order to improve the following deficits and impairments:     Visit Diagnosis: 1. Pain in left lower leg   2. Muscle weakness (generalized)   3. Chronic left-sided low back pain with left-sided sciatica        Problem List Patient Active Problem List   Diagnosis Date Noted  . Lumbar radiculopathy 09/09/2018  . Chronic bilateral low back pain with bilateral sciatica 09/09/2018  . Type 1 diabetes ([place]) 06/05/2017  . Renal insufficiency 06/05/2017  . 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 Pain of left hand 11/14/2015  . Degenerative arthritis of finger 11/14/2015  . Triggering of digit 11/14/2015  . 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 Proliferative diabetic retinopathy ([place]) 05/14/2015  . Macular puckering of retina, bilateral 05/14/2015  . Asymmetrical left sensorineural hearing loss 09/12/2014  . Brow ptosis 01/16/2014  . Dermatochalasis of eyelid 01/16/2014  . Pseudoaphakia 04/14/2013  . Primary open angle glaucoma 02/10/2013    Sherol Dade 11/23/2018, 5:05 PM  [facility] [address] [place], [place], [address] Phone: [phone]   Fax:  [phone]  Name: Jasmin Simmons MRN: [id number] Date of Birth: 01-Nov-1945

## 2018-11-23 NOTE — Patient Instructions (Signed)
Access Code: [email or web address]  URL: [email or web address]  Date: 11/23/2018  Prepared by: Sherol Dade   Exercises  Seated Piriformis Stretch - 3 reps - 1 sets - 30 hold - 1x daily - 7x weekly  Supine Bridge - 10 reps - 1 sets - 1x daily - 7x weekly  Heel Raise - 20 reps - 2 sets - 1x daily - 7x weekly  Seated Pelvic Tilt - 10 reps - 3 sets - 1x daily - 7x weekly  Seated Ankle Eversion with Resistance - 10 reps - 2 sets - 1x daily - 7x weekly  Sit to Stand - 10 reps - 1 sets - 1x daily - 7x weekly  Standing Ankle Dorsiflexion Stretch - 3 reps - 1 sets - 30 hold - 1x daily - 7x weekly  Clamshell with Resistance - 10 reps - 3 sets - 1x daily - 7x weekly    [facility] [address] [place], [place] [address] Phone # [phone] Fax [phone]

## 2018-11-30 ENCOUNTER — Ambulatory Visit: Payer: Medicare Other | Admitting: Physical Therapy

## 2018-12-07 ENCOUNTER — Ambulatory Visit: Payer: Medicare Other | Attending: Orthopaedic Surgery | Admitting: Physical Therapy

## 2018-12-07 ENCOUNTER — Encounter: Payer: Self-pay | Admitting: Physical Therapy

## 2018-12-07 ENCOUNTER — Other Ambulatory Visit: Payer: Self-pay

## 2018-12-07 DIAGNOSIS — G8929 Other chronic pain: Secondary | ICD-10-CM | POA: Diagnosis not present

## 2018-12-07 DIAGNOSIS — M5442 Lumbago with sciatica, left side: Secondary | ICD-10-CM | POA: Insufficient documentation

## 2018-12-07 DIAGNOSIS — M6281 Muscle weakness (generalized): Secondary | ICD-10-CM | POA: Insufficient documentation

## 2018-12-07 DIAGNOSIS — M79662 Pain in left lower leg: Secondary | ICD-10-CM | POA: Diagnosis not present

## 2018-12-07 NOTE — Therapy (Addendum)
[facility] [address] [place], [place], [address] Phone: [phone]   Fax:  [phone]  Physical Therapy Treatment/Hold   Patient Details  Name: Jasmin Simmons MRN: [id number] Date of Birth: April 03, 1946 Referring Provider (PT): Joni Fears, MD    Encounter Date: 12/07/2018  PT End of Session - 12/07/18 0913    Visit Number  9    Date for PT Re-Evaluation  12/23/18    Authorization Type  Medicare A and B    PT Start Time  0902    PT Stop Time  0947    PT Time Calculation (min)  45 min    Activity Tolerance  Patient tolerated treatment well    Behavior During Therapy  [facility] for tasks assessed/performed       Past Medical History:  Diagnosis Date  . Anemia   . Anxiety   . Cancer [facility])    breast cancer, stage 0  (radiation)  . Depression   . Diabetes mellitus without complication (HCC)    Type I  . Fibroid   . Gastroparesis    & rectal dysfunction  . GERD (gastroesophageal reflux disease)   . Hypertension   . Hypothyroidism   . PTSD (post-traumatic stress disorder)    secondary to eye problems  . Small intestinal bacterial overgrowth   . Thyroid disease    hypothyroidism    Past Surgical History:  Procedure Laterality Date  . BREAST BIOPSY Right 8/98   stereotactic core bx, epith hyperplasia  . CARPAL TUNNEL RELEASE Bilateral   . CATARACT EXTRACTION    . CESAREAN SECTION    . ENDOMETRIAL ABLATION    . PARS PLANA VITRECTOMY    . REPLACEMENT TOTAL KNEE Left 2016  . ROTATOR CUFF REPAIR Right   . TRIGGER FINGER RELEASE Bilateral   . TRIGGER FINGER RELEASE Left 12/04/2015   Procedure: RELEASE A-1 PULLEY LEFT RING FINGER;  Surgeon: Daryll Brod, MD;  Location: [place];  Service: Orthopedics;  Laterality: Left;  ANESTHESIA: IV REGIONAL UPPER ARM    There were no vitals filed for this visit.  Subjective Assessment - 12/07/18 0904    Subjective  Pt states that her pain in her Lt  buttock region is back. She tried calling Dr. Ernestina Patches to see if she can get back in with him. She tried to do her exercises a couple of times a week but had trouble since she was out of town.    Pertinent History  DM, Lt TKR, GERD     Currently in Pain?  No/denies         [facility] PT Assessment - 12/07/18 0001      Strength   Right Hip Flexion  5/5    Right Hip Extension  4+/5    Right Hip ABduction  4+/5    Left Hip Flexion  5/5    Left Hip Extension  4+/5    Left Hip ABduction  4+/5    Left Ankle Dorsiflexion  3+/5      Transfers   Five time sit to stand comments   13 sec, no UE       Ambulation/Gait   Gait Comments  Pt ambulates with Lt foot inversion during swing phase, improved ankle dorsiflexion and pushoff on Lt                    [facility] Adult PT Treatment/Exercise - 12/07/18 0001      Self-Care  Self-Care  Posture    Posture  lumbar roll for support while seated: demo in clinic and at pt's car      Lumbar Exercises: Stretches   Other Lumbar Stretch Exercise  long sitting Lt sciatic nerve glide x15 reps       Lumbar Exercises: Standing   Heel Raises  20 reps    Heel Raises Limitations  single leg heel raise       Lumbar Exercises: Supine   Other Supine Lumbar Exercises  hooklying low trunk rotation x15 reps Lt and Rt     Other Supine Lumbar Exercises  Lt ankle DF 2x10 reps with yellow TB              PT Education - 12/07/18 0959    Education Details  updated HEP; towel roll position for car    Person(s) Educated  Patient    Methods  Explanation;Handout;Verbal cues    Comprehension  Verbalized understanding;Returned demonstration       PT Short Term Goals - 12/07/18 0950      PT SHORT TERM GOAL #1   Title  Pt will be independent with her initial HEP to improve body mechanics, flexibility and strength.     Status  Achieved        PT Long Term Goals - 12/07/18 0950      PT LONG TERM GOAL #1   Title  Pt will have atleast 4/5 MMT  strength of BLE to improve her efficiency with daily activity.     Baseline  met except Lt ankle dorsiflexion    Time  4    Period  Weeks    Status  Partially Met      PT LONG TERM GOAL #2   Title  Pt will report being able to complete atleast 1 lap on the path around her home without the need for a rest break.     Time  6    Period  Weeks    Status  On-going      PT LONG TERM GOAL #3   Title  Pt will report atleast 70% improvement in her pain/symptoms from the start of PT which will increase her activity participation at home and in the community.    Baseline  70% improvement in mobility but pt unsure about pain as her symptoms have changed    Time  6    Period  Weeks    Status  Partially Met      PT LONG TERM GOAL #4   Title  Pt will be able to demonstrate proper log roll technique with transitioning in/out of bed, in order to decrease pain and strain on her low back.     Status  Achieved      PT LONG TERM GOAL #5   Title  Improved LE strength in hip and knee to perform 5x sit to stand in 13 sec or less    Baseline  13 sec    Time  6    Period  Weeks    Status  Achieved      PT LONG TERM GOAL #6   Title  The patient will demonstrate understanding of a safe self progression of a HEP for further improvements in strength and function    Time  6    Period  Weeks    Status  New            Plan - 12/07/18 0952    Clinical Impression  Statement  Pt has demonstrated good progress towards her goals since beginning PT. Her mobility and LE strength have greatly improved, as has her walking. She had good improvements in Lt buttock pain following initial dry needling and MD injection however this appears to have returned following increased time riding in the car over the last week. Pt has noted decrease in intensity in her Lt LE pain, noting that this is now a tingling compared to sharp pain initially. Pt has difficulty noting specific causes of her pain, but is able to complete her  therapy sessions without exacerbation of her symptoms and often with reports of improvements in her pain. Pt initially had poor HEP adherence but has recently tried to be more motivated with this. Today, she was educated on posture adjustments while seated in the car and pt noted this adjustment resolved her Lt buttock pain. She would like to be placed on hold so that she return to the physician who gave her the injection to discuss recent return of her Lt buttock pain. She was encouraged to continue with HEP adherence.    PT Frequency  1x / week    PT Duration  6 weeks    PT Treatment/Interventions  ADLs/Self Care Home Management;Electrical Stimulation;Therapeutic activities;Therapeutic exercise;Patient/family education;Neuromuscular re-education;Dry needling    PT Next Visit Plan  f/u on MD visit; trunk strength and f/u on towel roll    PT Home Exercise Plan   Access Code: [email or web address]        Patient will benefit from skilled therapeutic intervention in order to improve the following deficits and impairments:     Visit Diagnosis: 1. Pain in left lower leg   2. Muscle weakness (generalized)   3. Chronic left-sided low back pain with left-sided sciatica        Problem List Patient Active Problem List   Diagnosis Date Noted  . Lumbar radiculopathy 09/09/2018  . Chronic bilateral low back pain with bilateral sciatica 09/09/2018  . Type 1 diabetes ([place]) 06/05/2017  . Renal insufficiency 06/05/2017  . 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 Pain of left hand 11/14/2015  . Degenerative arthritis of finger 11/14/2015  . Triggering of digit 11/14/2015  . 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 Proliferative diabetic retinopathy ([place]) 05/14/2015  . Macular puckering of retina, bilateral 05/14/2015  . Asymmetrical left sensorineural hearing loss 09/12/2014  . Brow ptosis 01/16/2014  . Dermatochalasis of eyelid 01/16/2014  . Pseudoaphakia 04/14/2013  . Primary open angle  glaucoma 02/10/2013    10:02 AM,12/07/18 Sherol Dade PT, DPT [place] [facility] [address], [place], [place], [address] Phone: [phone]   Fax:  [phone]  Name: Jasmin Simmons MRN: [id number] Date of Birth: 13-Apr-1946   *addendum to resolve episode of care and d/c pt from [place]  Visits from Start of Care: 9  Current functional level related to goals / functional outcomes: See above for more details    Remaining deficits: See above for more details    Education / Equipment: See above for more details   Plan: Patient agrees to discharge.  Patient goals were partially met. Patient is being discharged due to lack of progress.  ?????         9:21 AM,01/20/19 Sherol Dade PT, [place] [facility] [place]

## 2018-12-07 NOTE — Patient Instructions (Signed)
Access Code: [email or web address]  URL: [email or web address]  Date: 12/07/2018  Prepared by: Sherol Dade   Exercises  Seated Piriformis Stretch - 3 reps - 1 sets - 30 hold - 1x daily - 7x weekly  Supine Bridge - 10 reps - 1 sets - 1x daily - 7x weekly  Seated Pelvic Tilt - 10 reps - 3 sets - 1x daily - 7x weekly  Seated Ankle Eversion with Resistance - 10 reps - 2 sets - 1x daily - 7x weekly  Sit to Stand - 10 reps - 1 sets - 1x daily - 7x weekly  Standing Ankle Dorsiflexion Stretch - 3 reps - 1 sets - 30 hold - 1x daily - 7x weekly  Clamshell with Resistance - 10 reps - 3 sets - 1x daily - 7x weekly  Ankle Dorsiflexion with Resistance - 15-20 reps - 2 sets - 1x daily - 7x weekly  Single Leg Heel Raise - 20 reps - 1x daily - 7x weekly    [facility] [address] [place], [place] [address] Phone # [phone] Fax [phone]

## 2018-12-08 ENCOUNTER — Telehealth: Payer: Self-pay | Admitting: Physical Medicine and Rehabilitation

## 2018-12-08 NOTE — Telephone Encounter (Signed)
If it is felt to be still related to the L5 nerve root which it can be in the calf and ankle then I would repeat the injection x1 if it were painful enough.  If this just still there but not affecting her that I would give it some time.  She can follow-up with orthopedics if she felt like it was more of her ankle.

## 2018-12-08 NOTE — Telephone Encounter (Signed)
Scheduled for 8/4 at 1315 with driver.

## 2018-12-20 ENCOUNTER — Other Ambulatory Visit: Payer: Self-pay | Admitting: Obstetrics & Gynecology

## 2018-12-20 NOTE — Telephone Encounter (Signed)
Medication refill request: macrobid  Last AEX:  06/05/17 SM Next AEX: none Last MMG (if hormonal medication request): 12/29/17 BIRADS2:benign.  Refill authorized: 11/15/18 #[id number]/0R. Today please advise.

## 2018-12-28 ENCOUNTER — Encounter: Payer: Self-pay | Admitting: Physical Medicine and Rehabilitation

## 2018-12-28 ENCOUNTER — Ambulatory Visit: Payer: Self-pay

## 2018-12-28 ENCOUNTER — Ambulatory Visit (INDEPENDENT_AMBULATORY_CARE_PROVIDER_SITE_OTHER): Payer: Medicare Other | Admitting: Physical Medicine and Rehabilitation

## 2018-12-28 VITALS — BP 164/79 | HR 83

## 2018-12-28 DIAGNOSIS — M48061 Spinal stenosis, lumbar region without neurogenic claudication: Secondary | ICD-10-CM | POA: Diagnosis not present

## 2018-12-28 DIAGNOSIS — M5416 Radiculopathy, lumbar region: Secondary | ICD-10-CM

## 2018-12-28 MED ORDER — BETAMETHASONE SOD PHOS & ACET 6 (3-3) MG/ML IJ SUSP
12.0000 mg | Freq: Once | INTRAMUSCULAR | Status: AC
  Administered 2018-12-28: 12 mg

## 2018-12-28 NOTE — Progress Notes (Signed)
.  Numeric Pain Rating Scale and Functional Assessment Average Pain 7   In the last MONTH (on 0-10 scale) has pain interfered with the following?  1. General activity like being  able to carry out your everyday physical activities such as walking, climbing stairs, carrying groceries, or moving a chair?  Rating(8)   +Driver, -BT, -Dye Allergies.  

## 2018-12-29 NOTE — Procedures (Signed)
Lumbosacral Transforaminal Epidural Steroid Injection - Sub-Pedicular Approach with Fluoroscopic Guidance  Patient: Jasmin Simmons      Date of Birth: 04-Jul-1945 MRN: [id number] PCP: Haywood Pao, MD      Visit Date: 12/28/2018   Universal Protocol:    Date/Time: 12/28/2018  Consent Given By: the patient  Position: PRONE  Additional Comments: Vital signs were monitored before and after the procedure. Patient was prepped and draped in the usual sterile fashion. The correct patient, procedure, and site was verified.   Injection Procedure Details:  Procedure Site One Meds Administered:  Meds ordered this encounter  Medications  . betamethasone acetate-betamethasone sodium phosphate (CELESTONE) injection 12 mg    Laterality: Left  Location/Site:  L5-S1  Needle size: 22 G  Needle type: Spinal  Needle Placement: Transforaminal  Findings:    -Comments: Excellent flow of contrast along the nerve and into the epidural space.  Procedure Details: After squaring off the end-plates to get a true AP view, the C-arm was positioned so that an oblique view of the foramen as noted above was visualized. The target area is just inferior to the "nose of the scotty dog" or sub pedicular. The soft tissues overlying this structure were infiltrated with 2-3 ml. of 1% Lidocaine without Epinephrine.  The spinal needle was inserted toward the target using a "trajectory" view along the fluoroscope beam.  Under AP and lateral visualization, the needle was advanced so it did not puncture dura and was located close the 6 O'Clock position of the pedical in AP tracterory. Biplanar projections were used to confirm position. Aspiration was confirmed to be negative for CSF and/or blood. A 1-2 ml. volume of Isovue-250 was injected and flow of contrast was noted at each level. Radiographs were obtained for documentation purposes.   After attaining the desired flow of contrast documented above, a 0.5 to  1.0 ml test dose of 0.25% Marcaine was injected into each respective transforaminal space.  The patient was observed for 90 seconds post injection.  After no sensory deficits were reported, and normal lower extremity motor function was noted,   the above injectate was administered so that equal amounts of the injectate were placed at each foramen (level) into the transforaminal epidural space.   Additional Comments:  The patient tolerated the procedure well Dressing: 2 x 2 sterile gauze and Band-Aid    Post-procedure details: Patient was observed during the procedure. Post-procedure instructions were reviewed.  Patient left the clinic in stable condition.

## 2018-12-29 NOTE — Progress Notes (Signed)
Jasmin Simmons - 73 y.o. female MRN [id number]  Date of birth: [birth year]  Office Visit Note: Visit Date: 12/28/2018 PCP: Haywood Pao, MD Referred by: Haywood Pao, MD  Subjective: Chief Complaint  Patient presents with  . Lower Back - Pain  . Left Leg - Pain   HPI:  Jasmin Simmons is a 73 y.o. female who comes in today For planned repeat left L5 transforaminal epidural steroid injection.  Patient continues to have back and left buttock and hip and leg symptoms consistent with L5 radicular pain.  She has had prior left total knee arthroplasty.  She reports injection did help quite a bit and she still undergoing physical therapy and she is walking better.  She still rates her pain however is a 7 out of 10.  We will go ahead and repeat the injection today.  MRI shows mainly lateral recess narrowing at L4-5 which could affect the L5 nerve root without focal compression.  ROS Otherwise per HPI.  Assessment & Plan: Visit Diagnoses:  1. Lumbar radiculopathy   2. Bilateral stenosis of lateral recess of lumbar spine     Plan: No additional findings.   Meds & Orders:  Meds ordered this encounter  Medications  . betamethasone acetate-betamethasone sodium phosphate (CELESTONE) injection 12 mg    Orders Placed This Encounter  Procedures  . XR C-ARM NO REPORT  . Epidural Steroid injection    Follow-up: No follow-ups on file.   Procedures: No procedures performed  Lumbosacral Transforaminal Epidural Steroid Injection - Sub-Pedicular Approach with Fluoroscopic Guidance  Patient: ASHLA MURPH      Date of Birth: 1946-03-12 MRN: [id number] PCP: Haywood Pao, MD      Visit Date: 12/28/2018   Universal Protocol:    Date/Time: 12/28/2018  Consent Given By: the patient  Position: PRONE  Additional Comments: Vital signs were monitored before and after the procedure. Patient was prepped and draped in the usual sterile fashion. The correct patient, procedure, and  site was verified.   Injection Procedure Details:  Procedure Site One Meds Administered:  Meds ordered this encounter  Medications  . betamethasone acetate-betamethasone sodium phosphate (CELESTONE) injection 12 mg    Laterality: Left  Location/Site:  L5-S1  Needle size: 22 G  Needle type: Spinal  Needle Placement: Transforaminal  Findings:    -Comments: Excellent flow of contrast along the nerve and into the epidural space.  Procedure Details: After squaring off the end-plates to get a true AP view, the C-arm was positioned so that an oblique view of the foramen as noted above was visualized. The target area is just inferior to the "nose of the scotty dog" or sub pedicular. The soft tissues overlying this structure were infiltrated with 2-3 ml. of 1% Lidocaine without Epinephrine.  The spinal needle was inserted toward the target using a "trajectory" view along the fluoroscope beam.  Under AP and lateral visualization, the needle was advanced so it did not puncture dura and was located close the 6 O'Clock position of the pedical in AP tracterory. Biplanar projections were used to confirm position. Aspiration was confirmed to be negative for CSF and/or blood. A 1-2 ml. volume of Isovue-250 was injected and flow of contrast was noted at each level. Radiographs were obtained for documentation purposes.   After attaining the desired flow of contrast documented above, a 0.5 to 1.0 ml test dose of 0.25% Marcaine was injected into each respective transforaminal space.  The patient was observed for  90 seconds post injection.  After no sensory deficits were reported, and normal lower extremity motor function was noted,   the above injectate was administered so that equal amounts of the injectate were placed at each foramen (level) into the transforaminal epidural space.   Additional Comments:  The patient tolerated the procedure well Dressing: 2 x 2 sterile gauze and Band-Aid     Post-procedure details: Patient was observed during the procedure. Post-procedure instructions were reviewed.  Patient left the clinic in stable condition.    Clinical History: MRI LUMBAR SPINE WITHOUT CONTRAST  TECHNIQUE: Multiplanar, multisequence MR imaging of the lumbar spine was performed. No intravenous contrast was administered.  COMPARISON:  Lumbar spine x-rays dated September 09, 2018.  FINDINGS: Segmentation:  Standard.  Alignment:  2 mm anterolisthesis at L3-L4.  Vertebrae:  No fracture, evidence of discitis, or bone lesion.  Conus medullaris and cauda equina: Conus extends to the L1 level. Conus and cauda equina appear normal.  Paraspinal and other soft tissues: Negative.  Disc levels:  T12-L1:  Negative.  L1-L2: Negative disc. Mild left ligamentum flavum hypertrophy. No stenosis.  L2-L3: Tiny biforaminal disc protrusions. Mild bilateral facet arthropathy with ligamentum flavum hypertrophy and tiny 2 mm left synovial cyst. No stenosis.  L3-L4: Mild disc bulging. Moderate to severe bilateral facet arthropathy. 1.1 cm synovial cyst in the posterior paraspinous soft tissues arising from the right facet joint. Moderate to severe spinal canal stenosis. Mild to moderate left and mild right lateral recess stenosis. Mild bilateral neuroforaminal stenosis.  L4-L5: Mild disc bulging with superimposed central and left subarticular disc protrusion. Mild bilateral facet arthropathy. Severe left and moderate to severe right lateral recess stenosis. Mild-to-moderate spinal canal stenosis. Mild right neuroforaminal stenosis. No left neuroforaminal stenosis.  L5-S1: Central and right paracentral disc protrusion with annular fissure. Mild bilateral facet arthropathy. Mild right lateral recess stenosis. No spinal canal or neuroforaminal stenosis.  IMPRESSION: 1. Multilevel lumbar spondylosis as described above. Symptomatic level likely L4-L5 where there  is severe left lateral recess stenosis affecting the descending left L5 nerve root. Additional moderate to severe right lateral recess stenosis at this level could affect the descending right L5 nerve root. 2. Additional moderate to severe spinal canal stenosis and mild-to-moderate left neuroforaminal stenosis at L3-L4.   Electronically Signed   By: Titus Dubin M.D.   On: 09/17/2018 16:43     Objective:  VS:  HT:    WT:   BMI:     BP:(!) 164/79  HR:83bpm  TEMP: ( )  RESP:  Physical Exam  Ortho Exam Imaging: Xr C-arm No Report  Result Date: 12/28/2018 Please see Notes tab for imaging impression.

## 2018-12-31 DIAGNOSIS — N184 Chronic kidney disease, stage 4 (severe): Secondary | ICD-10-CM | POA: Diagnosis not present

## 2018-12-31 DIAGNOSIS — E1122 Type 2 diabetes mellitus with diabetic chronic kidney disease: Secondary | ICD-10-CM | POA: Diagnosis not present

## 2018-12-31 DIAGNOSIS — N183 Chronic kidney disease, stage 3 (moderate): Secondary | ICD-10-CM | POA: Diagnosis not present

## 2018-12-31 DIAGNOSIS — Z794 Long term (current) use of insulin: Secondary | ICD-10-CM | POA: Diagnosis not present

## 2018-12-31 DIAGNOSIS — N2581 Secondary hyperparathyroidism of renal origin: Secondary | ICD-10-CM | POA: Diagnosis not present

## 2018-12-31 DIAGNOSIS — D649 Anemia, unspecified: Secondary | ICD-10-CM | POA: Diagnosis not present

## 2018-12-31 DIAGNOSIS — I129 Hypertensive chronic kidney disease with stage 1 through stage 4 chronic kidney disease, or unspecified chronic kidney disease: Secondary | ICD-10-CM | POA: Diagnosis not present

## 2019-01-05 DIAGNOSIS — Z794 Long term (current) use of insulin: Secondary | ICD-10-CM | POA: Diagnosis not present

## 2019-01-05 DIAGNOSIS — Z4681 Encounter for fitting and adjustment of insulin pump: Secondary | ICD-10-CM | POA: Diagnosis not present

## 2019-01-05 DIAGNOSIS — I10 Essential (primary) hypertension: Secondary | ICD-10-CM | POA: Diagnosis not present

## 2019-01-05 DIAGNOSIS — N184 Chronic kidney disease, stage 4 (severe): Secondary | ICD-10-CM | POA: Diagnosis not present

## 2019-01-05 DIAGNOSIS — E103593 Type 1 diabetes mellitus with proliferative diabetic retinopathy without macular edema, bilateral: Secondary | ICD-10-CM | POA: Diagnosis not present

## 2019-01-17 DIAGNOSIS — Z961 Presence of intraocular lens: Secondary | ICD-10-CM | POA: Diagnosis not present

## 2019-01-17 DIAGNOSIS — H18423 Band keratopathy, bilateral: Secondary | ICD-10-CM | POA: Diagnosis not present

## 2019-01-17 DIAGNOSIS — H401134 Primary open-angle glaucoma, bilateral, indeterminate stage: Secondary | ICD-10-CM | POA: Diagnosis not present

## 2019-01-20 DIAGNOSIS — Z23 Encounter for immunization: Secondary | ICD-10-CM | POA: Diagnosis not present

## 2019-02-18 ENCOUNTER — Other Ambulatory Visit: Payer: Self-pay | Admitting: Internal Medicine

## 2019-02-18 DIAGNOSIS — Z1231 Encounter for screening mammogram for malignant neoplasm of breast: Secondary | ICD-10-CM

## 2019-02-23 DIAGNOSIS — Z0000 Encounter for general adult medical examination without abnormal findings: Secondary | ICD-10-CM | POA: Diagnosis not present

## 2019-02-24 DIAGNOSIS — E162 Hypoglycemia, unspecified: Secondary | ICD-10-CM | POA: Diagnosis not present

## 2019-02-24 DIAGNOSIS — K3184 Gastroparesis: Secondary | ICD-10-CM | POA: Diagnosis not present

## 2019-02-24 DIAGNOSIS — Z794 Long term (current) use of insulin: Secondary | ICD-10-CM | POA: Diagnosis not present

## 2019-02-24 DIAGNOSIS — E103593 Type 1 diabetes mellitus with proliferative diabetic retinopathy without macular edema, bilateral: Secondary | ICD-10-CM | POA: Diagnosis not present

## 2019-02-24 DIAGNOSIS — E1040 Type 1 diabetes mellitus with diabetic neuropathy, unspecified: Secondary | ICD-10-CM | POA: Diagnosis not present

## 2019-02-24 DIAGNOSIS — E10319 Type 1 diabetes mellitus with unspecified diabetic retinopathy without macular edema: Secondary | ICD-10-CM | POA: Diagnosis not present

## 2019-02-24 DIAGNOSIS — K5909 Other constipation: Secondary | ICD-10-CM | POA: Diagnosis not present

## 2019-02-24 DIAGNOSIS — N2581 Secondary hyperparathyroidism of renal origin: Secondary | ICD-10-CM | POA: Diagnosis not present

## 2019-02-24 DIAGNOSIS — D631 Anemia in chronic kidney disease: Secondary | ICD-10-CM | POA: Diagnosis not present

## 2019-02-24 DIAGNOSIS — N184 Chronic kidney disease, stage 4 (severe): Secondary | ICD-10-CM | POA: Diagnosis not present

## 2019-02-24 DIAGNOSIS — E038 Other specified hypothyroidism: Secondary | ICD-10-CM | POA: Diagnosis not present

## 2019-02-24 DIAGNOSIS — E7800 Pure hypercholesterolemia, unspecified: Secondary | ICD-10-CM | POA: Diagnosis not present

## 2019-04-05 ENCOUNTER — Ambulatory Visit: Payer: Medicare Other

## 2019-04-07 DIAGNOSIS — E103593 Type 1 diabetes mellitus with proliferative diabetic retinopathy without macular edema, bilateral: Secondary | ICD-10-CM | POA: Diagnosis not present

## 2019-04-07 DIAGNOSIS — N184 Chronic kidney disease, stage 4 (severe): Secondary | ICD-10-CM | POA: Diagnosis not present

## 2019-04-07 DIAGNOSIS — Z794 Long term (current) use of insulin: Secondary | ICD-10-CM | POA: Diagnosis not present

## 2019-04-07 DIAGNOSIS — I129 Hypertensive chronic kidney disease with stage 1 through stage 4 chronic kidney disease, or unspecified chronic kidney disease: Secondary | ICD-10-CM | POA: Diagnosis not present

## 2019-04-07 DIAGNOSIS — Z4681 Encounter for fitting and adjustment of insulin pump: Secondary | ICD-10-CM | POA: Diagnosis not present

## 2019-04-22 DIAGNOSIS — E7800 Pure hypercholesterolemia, unspecified: Secondary | ICD-10-CM | POA: Diagnosis not present

## 2019-04-22 DIAGNOSIS — E038 Other specified hypothyroidism: Secondary | ICD-10-CM | POA: Diagnosis not present

## 2019-04-22 DIAGNOSIS — N184 Chronic kidney disease, stage 4 (severe): Secondary | ICD-10-CM | POA: Diagnosis not present

## 2019-04-22 DIAGNOSIS — E10319 Type 1 diabetes mellitus with unspecified diabetic retinopathy without macular edema: Secondary | ICD-10-CM | POA: Diagnosis not present

## 2019-04-27 DIAGNOSIS — R82998 Other abnormal findings in urine: Secondary | ICD-10-CM | POA: Diagnosis not present

## 2019-04-29 DIAGNOSIS — H401134 Primary open-angle glaucoma, bilateral, indeterminate stage: Secondary | ICD-10-CM | POA: Diagnosis not present

## 2019-04-29 DIAGNOSIS — N184 Chronic kidney disease, stage 4 (severe): Secondary | ICD-10-CM | POA: Diagnosis not present

## 2019-04-29 DIAGNOSIS — Z794 Long term (current) use of insulin: Secondary | ICD-10-CM | POA: Diagnosis not present

## 2019-04-29 DIAGNOSIS — D631 Anemia in chronic kidney disease: Secondary | ICD-10-CM | POA: Diagnosis not present

## 2019-04-29 DIAGNOSIS — E1040 Type 1 diabetes mellitus with diabetic neuropathy, unspecified: Secondary | ICD-10-CM | POA: Diagnosis not present

## 2019-04-29 DIAGNOSIS — E10319 Type 1 diabetes mellitus with unspecified diabetic retinopathy without macular edema: Secondary | ICD-10-CM | POA: Diagnosis not present

## 2019-04-29 DIAGNOSIS — Z0000 Encounter for general adult medical examination without abnormal findings: Secondary | ICD-10-CM | POA: Diagnosis not present

## 2019-04-29 DIAGNOSIS — E162 Hypoglycemia, unspecified: Secondary | ICD-10-CM | POA: Diagnosis not present

## 2019-04-29 DIAGNOSIS — N2581 Secondary hyperparathyroidism of renal origin: Secondary | ICD-10-CM | POA: Diagnosis not present

## 2019-04-29 DIAGNOSIS — Z4681 Encounter for fitting and adjustment of insulin pump: Secondary | ICD-10-CM | POA: Diagnosis not present

## 2019-04-29 DIAGNOSIS — Z1339 Encounter for screening examination for other mental health and behavioral disorders: Secondary | ICD-10-CM | POA: Diagnosis not present

## 2019-04-29 DIAGNOSIS — M7121 Synovial cyst of popliteal space [Baker], right knee: Secondary | ICD-10-CM | POA: Diagnosis not present

## 2019-05-01 ENCOUNTER — Other Ambulatory Visit: Payer: Self-pay | Admitting: Obstetrics & Gynecology

## 2019-05-02 NOTE — Telephone Encounter (Signed)
Medication refill request: Macrobid Last AEX:  06-05-17 SM  Next AEX: not currently scheduled  Last MMG (if hormonal medication request): n/a Refill authorized: Today, please advise.   Medication pended for #30, 0RF. Please refill if appropriate.

## 2019-05-04 DIAGNOSIS — Z20828 Contact with and (suspected) exposure to other viral communicable diseases: Secondary | ICD-10-CM | POA: Diagnosis not present

## 2019-06-17 DIAGNOSIS — Z23 Encounter for immunization: Secondary | ICD-10-CM | POA: Diagnosis not present

## 2019-07-08 DIAGNOSIS — Z23 Encounter for immunization: Secondary | ICD-10-CM | POA: Diagnosis not present

## 2019-07-12 DIAGNOSIS — Z4681 Encounter for fitting and adjustment of insulin pump: Secondary | ICD-10-CM | POA: Diagnosis not present

## 2019-07-12 DIAGNOSIS — Z794 Long term (current) use of insulin: Secondary | ICD-10-CM | POA: Diagnosis not present

## 2019-07-12 DIAGNOSIS — I129 Hypertensive chronic kidney disease with stage 1 through stage 4 chronic kidney disease, or unspecified chronic kidney disease: Secondary | ICD-10-CM | POA: Diagnosis not present

## 2019-07-12 DIAGNOSIS — N184 Chronic kidney disease, stage 4 (severe): Secondary | ICD-10-CM | POA: Diagnosis not present

## 2019-07-12 DIAGNOSIS — E103593 Type 1 diabetes mellitus with proliferative diabetic retinopathy without macular edema, bilateral: Secondary | ICD-10-CM | POA: Diagnosis not present

## 2019-07-26 DIAGNOSIS — E103593 Type 1 diabetes mellitus with proliferative diabetic retinopathy without macular edema, bilateral: Secondary | ICD-10-CM | POA: Diagnosis not present

## 2019-07-26 DIAGNOSIS — H35373 Puckering of macula, bilateral: Secondary | ICD-10-CM | POA: Diagnosis not present

## 2019-07-26 DIAGNOSIS — H401134 Primary open-angle glaucoma, bilateral, indeterminate stage: Secondary | ICD-10-CM | POA: Diagnosis not present

## 2019-07-26 DIAGNOSIS — Z961 Presence of intraocular lens: Secondary | ICD-10-CM | POA: Diagnosis not present

## 2019-08-03 DIAGNOSIS — H52203 Unspecified astigmatism, bilateral: Secondary | ICD-10-CM | POA: Diagnosis not present

## 2019-08-03 DIAGNOSIS — Z961 Presence of intraocular lens: Secondary | ICD-10-CM | POA: Diagnosis not present

## 2019-08-03 DIAGNOSIS — H18423 Band keratopathy, bilateral: Secondary | ICD-10-CM | POA: Diagnosis not present

## 2019-08-03 DIAGNOSIS — H401134 Primary open-angle glaucoma, bilateral, indeterminate stage: Secondary | ICD-10-CM | POA: Diagnosis not present

## 2019-08-03 DIAGNOSIS — H524 Presbyopia: Secondary | ICD-10-CM | POA: Diagnosis not present

## 2019-08-03 DIAGNOSIS — H5203 Hypermetropia, bilateral: Secondary | ICD-10-CM | POA: Diagnosis not present

## 2019-08-05 DIAGNOSIS — E7800 Pure hypercholesterolemia, unspecified: Secondary | ICD-10-CM | POA: Diagnosis not present

## 2019-08-05 DIAGNOSIS — K3184 Gastroparesis: Secondary | ICD-10-CM | POA: Diagnosis not present

## 2019-08-05 DIAGNOSIS — E10319 Type 1 diabetes mellitus with unspecified diabetic retinopathy without macular edema: Secondary | ICD-10-CM | POA: Diagnosis not present

## 2019-08-05 DIAGNOSIS — Z1331 Encounter for screening for depression: Secondary | ICD-10-CM | POA: Diagnosis not present

## 2019-08-05 DIAGNOSIS — Z4681 Encounter for fitting and adjustment of insulin pump: Secondary | ICD-10-CM | POA: Diagnosis not present

## 2019-08-05 DIAGNOSIS — R7889 Finding of other specified substances, not normally found in blood: Secondary | ICD-10-CM | POA: Diagnosis not present

## 2019-08-05 DIAGNOSIS — Z794 Long term (current) use of insulin: Secondary | ICD-10-CM | POA: Diagnosis not present

## 2019-08-05 DIAGNOSIS — I129 Hypertensive chronic kidney disease with stage 1 through stage 4 chronic kidney disease, or unspecified chronic kidney disease: Secondary | ICD-10-CM | POA: Diagnosis not present

## 2019-08-05 DIAGNOSIS — E103593 Type 1 diabetes mellitus with proliferative diabetic retinopathy without macular edema, bilateral: Secondary | ICD-10-CM | POA: Diagnosis not present

## 2019-08-05 DIAGNOSIS — K5909 Other constipation: Secondary | ICD-10-CM | POA: Diagnosis not present

## 2019-08-05 DIAGNOSIS — E663 Overweight: Secondary | ICD-10-CM | POA: Diagnosis not present

## 2019-08-05 DIAGNOSIS — N184 Chronic kidney disease, stage 4 (severe): Secondary | ICD-10-CM | POA: Diagnosis not present

## 2019-09-05 ENCOUNTER — Other Ambulatory Visit: Payer: Self-pay | Admitting: Obstetrics & Gynecology

## 2019-09-05 NOTE — Telephone Encounter (Signed)
Spoke with patient and advised we received refill request on Nitrofurantoin 100mg , #30. Patient states she is having urinary frequency and burning. She is in Stage IV kidney disease. Requesting appointment to see Dr.Miller due to symptoms and has questions about taking Nitrofurantoin.  Advised patient will discuss with Dr.Miller and call her back.

## 2019-09-05 NOTE — Telephone Encounter (Signed)
Message left with patient's husband, [name], to have patient contact office. No details given.

## 2019-09-06 ENCOUNTER — Other Ambulatory Visit: Payer: Self-pay

## 2019-09-06 NOTE — Progress Notes (Signed)
GYNECOLOGY  VISIT  CC:   Patient complains of having frequent urination, vaginal discharge, and burning. Patient has been taking AZO.  HPI: 74 y.o. G1P1001 Married White or Caucasian female here for urinary frequency/burning.  She's had symptoms about 7 days.  She's been taking AZO helps some.  She called yesterday and was advised to come in and have urine culture.  She has used macrobid intermittently and she thinks she's used it twice since December for symptoms.    She is followed by nephrology, Dr. Rogers Blocker, in December.  Her most recent creatine 05/11/2019 was 1.8.  Calculated creatinine clearance was 34.  Pt reports she recently saw Dr. Osborne Casco more recently and pt states her creatinine clearance was <30 with her last lab work.  I do not have copy of this but pt is very knowledgeable about her history and lab work.    Denies vaginal bleeding.  Last pap was negative and 2019.    GYNECOLOGIC HISTORY: Patient's last menstrual period was 07/25/1994. Contraception:PMP Menopausal hormone therapy: none  Patient Active Problem List   Diagnosis Date Noted  . Lumbar radiculopathy 09/09/2018  . Chronic bilateral low back pain with bilateral sciatica 09/09/2018  . Type 1 diabetes ([place]) 06/05/2017  . Renal insufficiency 06/05/2017  . 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 Pain of left hand 11/14/2015  . Degenerative arthritis of finger 11/14/2015  . Triggering of digit 11/14/2015  . 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 Proliferative diabetic retinopathy ([place]) 05/14/2015  . Macular puckering of retina, bilateral 05/14/2015  . Asymmetrical left sensorineural hearing loss 09/12/2014  . Brow ptosis 01/16/2014  . Dermatochalasis of eyelid 01/16/2014  . Pseudoaphakia 04/14/2013  . Primary open angle glaucoma 02/10/2013    Past Medical History:  Diagnosis Date  . Anemia   . Anxiety   . Cancer [facility])    breast cancer, stage 0  (radiation)  . Depression   .  Diabetes mellitus without complication (HCC)    Type I  . Fibroid   . Gastroparesis    & rectal dysfunction  . GERD (gastroesophageal reflux disease)   . Hypertension   . Hypothyroidism   . PTSD (post-traumatic stress disorder)    secondary to eye problems  . Small intestinal bacterial overgrowth   . Thyroid disease    hypothyroidism    Past Surgical History:  Procedure Laterality Date  . BREAST BIOPSY Right 8/98   stereotactic core bx, epith hyperplasia  . CARPAL TUNNEL RELEASE Bilateral   . CATARACT EXTRACTION    . CESAREAN SECTION    . ENDOMETRIAL ABLATION    . PARS PLANA VITRECTOMY    . REPLACEMENT TOTAL KNEE Left 2016  . ROTATOR CUFF REPAIR Right   . TRIGGER FINGER RELEASE Bilateral   . TRIGGER FINGER RELEASE Left 12/04/2015   Procedure: RELEASE A-1 PULLEY LEFT RING FINGER;  Surgeon: Daryll Brod, MD;  Location: [place];  Service: Orthopedics;  Laterality: Left;  ANESTHESIA: IV REGIONAL UPPER ARM    MEDS:   Current Outpatient Medications on File Prior to Visit  Medication Sig Dispense Refill  . atorvastatin (LIPITOR) 40 MG tablet Take 40 mg by mouth once a day  1  . BAQSIMI TWO PACK 3 MG/DOSE POWD USE AS NEEDED FOR SEVERE HYPOGLYCEMIA    . Blood Glucose Monitoring Suppl (ONETOUCH VERIO FLEX SYSTEM) w/Device KIT USE 1 TO CHECK GLUCOSE THREE TIMES DAILY    . buPROPion (WELLBUTRIN SR) 200 MG 12 hr tablet  Take 200 mg by mouth 2 (two) times daily.     . cholecalciferol (VITAMIN D) 1000 units tablet Take 2,000 Units by mouth daily.    . Cyanocobalamin (B-12 PO) Take 1 tablet by mouth daily with breakfast.    . diazepam (VALIUM) 5 MG tablet Take 10 mg by mouth at bedtime.     . docusate sodium (COLACE) 100 MG capsule Take 200 mg by mouth daily.    . furosemide (LASIX) 20 MG tablet     . hyoscyamine (LEVSIN SL) 0.125 MG SL tablet Place 0.125 mg under the tongue every 6 (six) hours as needed for cramping.     . insulin glulisine (APIDRA) 100 UNIT/ML injection  See admin instructions. Average total of 35 units/day PER INSULIN PUMP    . KRISTALOSE 20 g packet Dissolve the contents of 1 packet into 4 ounces of water and drink once a day prn  3  . latanoprost (XALATAN) 0.005 % ophthalmic solution at bedtime.    Marland Kitchen levothyroxine (SYNTHROID, LEVOTHROID) 100 MCG tablet Take 100 mcg by mouth daily before breakfast.    . LINZESS 290 MCG CAPS capsule Take 290 mcg by mouth as needed.   3  . ondansetron (ZOFRAN-ODT) 8 MG disintegrating tablet Take 8 mg by mouth every 8 (eight) hours as needed.    . pantoprazole (PROTONIX) 40 MG tablet Take 40 mg by mouth 2 (two) times daily.     Vladimir Faster Glycol-Propyl Glycol (SYSTANE PRESERVATIVE FREE OP) Place 1-2 drops into both eyes 2 (two) times daily as needed (for irritation).     . Propylene Glycol (SYSTANE COMPLETE OP) Apply to eye.    . ramipril (ALTACE) 10 MG capsule Take 10 mg by mouth once a day  2  . temazepam (RESTORIL) 15 MG capsule Take 15 mg by mouth at bedtime.    . nitrofurantoin, macrocrystal-monohydrate, (MACROBID) 100 MG capsule Take 1 capsule by mouth once daily (Patient not taking: Reported on 09/07/2019) 30 capsule 1   Current Facility-Administered Medications on File Prior to Visit  Medication Dose Route Frequency Provider Last Rate Last Admin  . betamethasone acetate-betamethasone sodium phosphate (CELESTONE) injection 12 mg  12 mg Other Once Magnus Sinning, MD        ALLERGIES: Tobramycin-dexamethasone, Ciprofloxacin, and Codeine  Family History  Problem Relation Age of Onset  . Cancer Mother 44       uterian cancer   . Stroke Father     SH:  Married, non smoker  Review of Systems  Genitourinary: Positive for frequency and vaginal discharge.       Urinary burning  All other systems reviewed and are negative.   PHYSICAL EXAMINATION:    BP 124/70 (BP Location: Right Arm, Patient Position: Sitting, Cuff Size: Normal)   Pulse 84   Temp (!) 97.5 F (36.4 C) (Temporal)   Resp 10   Ht  '5\' 7"'  (1.702 m)   Wt 171 lb (77.6 kg)   LMP 07/25/1994   BMI 26.78 kg/m     General appearance: alert, cooperative and appears stated age Flank:  No CVA tenderness Abdomen: soft, non-tender; bowel sounds normal; no masses,  no organomegaly Lymph:  no inguinal LAD noted  Pelvic: External genitalia:  no lesions              Urethra:  normal appearing urethra with no masses, tenderness or lesions              Bartholins and Skenes: normal  Vagina: normal appearing vagina with normal color and discharge, no lesions              Cervix: no lesions              Bimanual Exam:  Uterus:  normal size, contour, position, consistency, mobility, non-tender              Adnexa: no mass, fullness, tenderness  Chaperone, Terence Lux, CMA, was present for exam.  Assessment: Cystitis H/o recurrent cystitis Renal insufficiency, now followed by nephrology at [place]: Urine culture pending Renal dosed Bactrim sent to pharmacy on file.  1 DS tab, then 1 SS tab every 12 hours for 5 more doses.  #7/0RF. Affirm pending

## 2019-09-07 ENCOUNTER — Ambulatory Visit (INDEPENDENT_AMBULATORY_CARE_PROVIDER_SITE_OTHER): Payer: Medicare Other | Admitting: Obstetrics & Gynecology

## 2019-09-07 ENCOUNTER — Encounter: Payer: Self-pay | Admitting: Obstetrics & Gynecology

## 2019-09-07 VITALS — BP 124/70 | HR 84 | Temp 97.5°F | Resp 10 | Ht 67.0 in | Wt 171.0 lb

## 2019-09-07 DIAGNOSIS — R350 Frequency of micturition: Secondary | ICD-10-CM

## 2019-09-07 DIAGNOSIS — R3129 Other microscopic hematuria: Secondary | ICD-10-CM | POA: Diagnosis not present

## 2019-09-07 DIAGNOSIS — N3090 Cystitis, unspecified without hematuria: Secondary | ICD-10-CM

## 2019-09-07 DIAGNOSIS — N898 Other specified noninflammatory disorders of vagina: Secondary | ICD-10-CM

## 2019-09-07 MED ORDER — SULFAMETHOXAZOLE-TRIMETHOPRIM 400-80 MG PO TABS: ORAL_TABLET | ORAL | 0 refills | Status: DC

## 2019-09-08 LAB — URINALYSIS, MICROSCOPIC ONLY
Bacteria, UA: NONE SEEN
Casts: NONE SEEN /lpf
Epithelial Cells (non renal): NONE SEEN /hpf (ref 0–10)
RBC, Urine: >30 /hpf — AB (ref 0–2)
WBC, UA: NONE SEEN /hpf (ref 0–5)

## 2019-09-08 LAB — VAGINITIS/VAGINOSIS, DNA PROBE
Candida Species: NEGATIVE
Gardnerella vaginalis: NEGATIVE
Trichomonas vaginosis: NEGATIVE

## 2019-09-09 LAB — URINE CULTURE

## 2019-09-11 NOTE — Addendum Note (Signed)
Addended by: Megan Salon on: 09/11/2019 05:35 AM   Modules accepted: Orders

## 2019-09-13 ENCOUNTER — Telehealth: Payer: Self-pay

## 2019-09-13 NOTE — Telephone Encounter (Signed)
-----   Message from Megan Salon, MD sent at 09/11/2019  5:35 AM EDT ----- Please let pt know her urine culture showed e coli.  It was resistant to macrobid but sensitive to bactrim.  I treated her with bactrim.  Would recommend a repeat urine micro and culture in two weeks due to having red calls in her micro and the e coli.  Orders placed.  Thanks.

## 2019-09-13 NOTE — Telephone Encounter (Signed)
Spoke with patient. Results given. Patient verbalizes understanding. Patient states that she is not having any symptoms after completing antibiotic. Nurse visit for repeat urine culture and micro scheduled for 09/26/2019 at 2:45 pm. Patient is agreeable to date and time. Declines earlier appointment due to other appointments scheduled. Encounter closed.

## 2019-09-13 NOTE — Telephone Encounter (Signed)
Patient returning call to Kaitlyn. °

## 2019-09-13 NOTE — Telephone Encounter (Signed)
Left message to call Sir Mallis at [phone]. 

## 2019-09-23 DIAGNOSIS — R7989 Other specified abnormal findings of blood chemistry: Secondary | ICD-10-CM | POA: Diagnosis not present

## 2019-09-23 DIAGNOSIS — N183 Chronic kidney disease, stage 3 unspecified: Secondary | ICD-10-CM | POA: Diagnosis not present

## 2019-09-23 DIAGNOSIS — N2581 Secondary hyperparathyroidism of renal origin: Secondary | ICD-10-CM | POA: Diagnosis not present

## 2019-09-23 DIAGNOSIS — I129 Hypertensive chronic kidney disease with stage 1 through stage 4 chronic kidney disease, or unspecified chronic kidney disease: Secondary | ICD-10-CM | POA: Diagnosis not present

## 2019-09-23 DIAGNOSIS — N184 Chronic kidney disease, stage 4 (severe): Secondary | ICD-10-CM | POA: Diagnosis not present

## 2019-09-23 DIAGNOSIS — E875 Hyperkalemia: Secondary | ICD-10-CM | POA: Diagnosis not present

## 2019-09-25 ENCOUNTER — Encounter: Payer: Self-pay | Admitting: Obstetrics & Gynecology

## 2019-09-26 ENCOUNTER — Ambulatory Visit: Payer: Medicare Other

## 2019-09-26 ENCOUNTER — Other Ambulatory Visit: Payer: Self-pay

## 2019-09-26 VITALS — BP 125/68 | HR 65 | Temp 98.8°F | Wt 174.2 lb

## 2019-09-26 DIAGNOSIS — N3090 Cystitis, unspecified without hematuria: Secondary | ICD-10-CM

## 2019-09-26 DIAGNOSIS — R3129 Other microscopic hematuria: Secondary | ICD-10-CM | POA: Diagnosis not present

## 2019-09-26 NOTE — Progress Notes (Signed)
Pt here to give repeat Urine for micro and culture per Dr Sabra Heck 09/11/19. Future orders done.   Pt aware of call for results when back . Pt agreeable and verbalized understanding.

## 2019-09-27 LAB — URINALYSIS, MICROSCOPIC ONLY
Bacteria, UA: NONE SEEN
Casts: NONE SEEN /lpf
RBC, Urine: NONE SEEN /hpf (ref 0–2)

## 2019-09-28 LAB — URINE CULTURE

## 2019-10-11 DIAGNOSIS — E103593 Type 1 diabetes mellitus with proliferative diabetic retinopathy without macular edema, bilateral: Secondary | ICD-10-CM | POA: Diagnosis not present

## 2019-10-11 DIAGNOSIS — Z4681 Encounter for fitting and adjustment of insulin pump: Secondary | ICD-10-CM | POA: Diagnosis not present

## 2019-10-11 DIAGNOSIS — I129 Hypertensive chronic kidney disease with stage 1 through stage 4 chronic kidney disease, or unspecified chronic kidney disease: Secondary | ICD-10-CM | POA: Diagnosis not present

## 2019-10-11 DIAGNOSIS — N184 Chronic kidney disease, stage 4 (severe): Secondary | ICD-10-CM | POA: Diagnosis not present

## 2019-10-11 DIAGNOSIS — Z794 Long term (current) use of insulin: Secondary | ICD-10-CM | POA: Diagnosis not present

## 2019-10-20 DIAGNOSIS — Z1211 Encounter for screening for malignant neoplasm of colon: Secondary | ICD-10-CM | POA: Diagnosis not present

## 2019-10-20 DIAGNOSIS — K219 Gastro-esophageal reflux disease without esophagitis: Secondary | ICD-10-CM | POA: Diagnosis not present

## 2019-10-20 DIAGNOSIS — K2100 Gastro-esophageal reflux disease with esophagitis, without bleeding: Secondary | ICD-10-CM | POA: Diagnosis not present

## 2019-10-20 DIAGNOSIS — K6389 Other specified diseases of intestine: Secondary | ICD-10-CM | POA: Diagnosis not present

## 2019-10-20 DIAGNOSIS — R109 Unspecified abdominal pain: Secondary | ICD-10-CM | POA: Diagnosis not present

## 2019-10-20 DIAGNOSIS — K5904 Chronic idiopathic constipation: Secondary | ICD-10-CM | POA: Diagnosis not present

## 2019-11-03 DIAGNOSIS — E7800 Pure hypercholesterolemia, unspecified: Secondary | ICD-10-CM | POA: Diagnosis not present

## 2019-11-03 DIAGNOSIS — E039 Hypothyroidism, unspecified: Secondary | ICD-10-CM | POA: Diagnosis not present

## 2019-11-03 DIAGNOSIS — N184 Chronic kidney disease, stage 4 (severe): Secondary | ICD-10-CM | POA: Diagnosis not present

## 2019-11-03 DIAGNOSIS — D631 Anemia in chronic kidney disease: Secondary | ICD-10-CM | POA: Diagnosis not present

## 2019-11-03 DIAGNOSIS — I129 Hypertensive chronic kidney disease with stage 1 through stage 4 chronic kidney disease, or unspecified chronic kidney disease: Secondary | ICD-10-CM | POA: Diagnosis not present

## 2019-11-03 DIAGNOSIS — N2581 Secondary hyperparathyroidism of renal origin: Secondary | ICD-10-CM | POA: Diagnosis not present

## 2019-11-03 DIAGNOSIS — E103593 Type 1 diabetes mellitus with proliferative diabetic retinopathy without macular edema, bilateral: Secondary | ICD-10-CM | POA: Diagnosis not present

## 2019-11-03 DIAGNOSIS — Z794 Long term (current) use of insulin: Secondary | ICD-10-CM | POA: Diagnosis not present

## 2019-11-06 ENCOUNTER — Other Ambulatory Visit: Payer: Self-pay | Admitting: Obstetrics & Gynecology

## 2019-12-30 ENCOUNTER — Other Ambulatory Visit: Payer: Self-pay | Admitting: Obstetrics & Gynecology

## 2019-12-30 ENCOUNTER — Encounter: Payer: Self-pay | Admitting: Obstetrics & Gynecology

## 2019-12-30 ENCOUNTER — Other Ambulatory Visit: Payer: Self-pay

## 2019-12-30 ENCOUNTER — Ambulatory Visit (INDEPENDENT_AMBULATORY_CARE_PROVIDER_SITE_OTHER): Payer: Medicare Other | Admitting: Obstetrics & Gynecology

## 2019-12-30 VITALS — BP 124/80 | HR 68 | Resp 16 | Wt 166.0 lb

## 2019-12-30 DIAGNOSIS — N390 Urinary tract infection, site not specified: Secondary | ICD-10-CM

## 2019-12-30 DIAGNOSIS — N898 Other specified noninflammatory disorders of vagina: Secondary | ICD-10-CM | POA: Diagnosis not present

## 2019-12-30 DIAGNOSIS — K6389 Other specified diseases of intestine: Secondary | ICD-10-CM | POA: Diagnosis not present

## 2019-12-30 LAB — POCT URINALYSIS DIPSTICK
Bilirubin, UA: NEGATIVE
Blood, UA: NEGATIVE
Glucose, UA: NEGATIVE
Ketones, UA: NEGATIVE
Leukocytes, UA: NEGATIVE
Nitrite, UA: NEGATIVE
Protein, UA: NEGATIVE
Urobilinogen, UA: NEGATIVE E.U./dL — AB
pH, UA: 5 (ref 5.0–8.0)

## 2019-12-30 MED ORDER — SULFAMETHOXAZOLE-TRIMETHOPRIM 400-80 MG PO TABS: ORAL_TABLET | ORAL | 0 refills | Status: AC

## 2019-12-30 NOTE — Progress Notes (Signed)
GYNECOLOGY  VISIT  CC:   Possible UTI  HPI: 74 y.o. G70P1001 Married White or Caucasian female here for possible UTI.  This is a recurrent problem for her but she hasn't had any symptoms since May.  Recently she had to take a Linzess and this cause significant diarrhea.  She tries to be very careful with cleaning when this happens but this is a set-up for her for a UTI.  She's noticed some increased urinary frequency and hesitancy since that time.  Denies dysuria but typically doesn't have dysuria with a UTI.  Denies fever or chills.  Denies back pain/flank pain.  poct urine-neg  GYNECOLOGIC HISTORY: Patient's last menstrual period was 07/25/1994. Contraception: post menopausal Menopausal hormone therapy: none  Patient Active Problem List   Diagnosis Date Noted  . Lumbar radiculopathy 09/09/2018  . Chronic bilateral low back pain with bilateral sciatica 09/09/2018  . Type 1 diabetes ([place]) 06/05/2017  . Renal insufficiency 06/05/2017  . 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 Pain of left hand 11/14/2015  . Degenerative arthritis of finger 11/14/2015  . Triggering of digit 11/14/2015  . 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 Proliferative diabetic retinopathy ([place]) 05/14/2015  . Macular puckering of retina, bilateral 05/14/2015  . Asymmetrical left sensorineural hearing loss 09/12/2014  . Brow ptosis 01/16/2014  . Dermatochalasis of eyelid 01/16/2014  . Pseudoaphakia 04/14/2013  . Primary open angle glaucoma 02/10/2013    Past Medical History:  Diagnosis Date  . Anemia   . Anxiety   . Cancer [facility])    breast cancer, stage 0  (radiation)  . Depression   . Diabetes mellitus without complication (HCC)    Type I  . Fibroid   . Gastroparesis    & rectal dysfunction  . GERD (gastroesophageal reflux disease)   . Hypertension   . Hypothyroidism   . Kidney disease    level 4  . PTSD (post-traumatic stress disorder)    secondary to eye problems  . Small  intestinal bacterial overgrowth   . Thyroid disease    hypothyroidism    Past Surgical History:  Procedure Laterality Date  . BREAST BIOPSY Right 8/98   stereotactic core bx, epith hyperplasia  . CARPAL TUNNEL RELEASE Bilateral   . CATARACT EXTRACTION    . CESAREAN SECTION    . ENDOMETRIAL ABLATION    . PARS PLANA VITRECTOMY    . REPLACEMENT TOTAL KNEE Left 2016  . ROTATOR CUFF REPAIR Right   . TRIGGER FINGER RELEASE Bilateral   . TRIGGER FINGER RELEASE Left 12/04/2015   Procedure: RELEASE A-1 PULLEY LEFT RING FINGER;  Surgeon: Daryll Brod, MD;  Location: [place];  Service: Orthopedics;  Laterality: Left;  ANESTHESIA: IV REGIONAL UPPER ARM    MEDS:   Current Outpatient Medications on File Prior to Visit  Medication Sig Dispense Refill  . atorvastatin (LIPITOR) 40 MG tablet Take 40 mg by mouth once a day  1  . Blood Glucose Monitoring Suppl (ONETOUCH VERIO FLEX SYSTEM) w/Device KIT USE 1 TO CHECK GLUCOSE THREE TIMES DAILY    . buPROPion (WELLBUTRIN SR) 200 MG 12 hr tablet Take 200 mg by mouth 2 (two) times daily.     . cholecalciferol (VITAMIN D) 1000 units tablet Take 2,000 Units by mouth daily.    . Cyanocobalamin (B-12 PO) Take 1 tablet by mouth daily with breakfast.    . diazepam (VALIUM) 5 MG tablet Take 10 mg by mouth at bedtime.     Marland Kitchen  docusate sodium (COLACE) 100 MG capsule Take 200 mg by mouth daily.    . EUTHYROX 75 MCG tablet Take 75 mcg by mouth daily.    . furosemide (LASIX) 20 MG tablet     . hyoscyamine (LEVSIN SL) 0.125 MG SL tablet Place 0.125 mg under the tongue every 6 (six) hours as needed for cramping.     . insulin glulisine (APIDRA) 100 UNIT/ML injection See admin instructions. Average total of 35 units/day PER INSULIN PUMP    . KRISTALOSE 20 g packet Dissolve the contents of 1 packet into 4 ounces of water and drink once a day prn  3  . latanoprost (XALATAN) 0.005 % ophthalmic solution at bedtime.    Marland Kitchen levothyroxine (SYNTHROID, LEVOTHROID)  100 MCG tablet Take 100 mcg by mouth daily before breakfast.    . LINZESS 145 MCG CAPS capsule Take 145 mcg by mouth daily.    . ondansetron (ZOFRAN-ODT) 8 MG disintegrating tablet Take 8 mg by mouth every 8 (eight) hours as needed.    . pantoprazole (PROTONIX) 40 MG tablet Take 40 mg by mouth 2 (two) times daily.     Vladimir Faster Glycol-Propyl Glycol (SYSTANE PRESERVATIVE FREE OP) Place 1-2 drops into both eyes 2 (two) times daily as needed (for irritation).     . ramipril (ALTACE) 5 MG capsule Take 5 mg by mouth daily.    . temazepam (RESTORIL) 15 MG capsule Take 15 mg by mouth at bedtime.    Marland Kitchen BAQSIMI TWO PACK 3 MG/DOSE POWD USE AS NEEDED FOR SEVERE HYPOGLYCEMIA (Patient not taking: Reported on 12/30/2019)    . sulfamethoxazole-trimethoprim (BACTRIM) 400-80 MG tablet Take 2 tablets x 1, then tablet every 12 hours for 5 more doses. (Patient not taking: Reported on 12/30/2019) 7 tablet 0   Current Facility-Administered Medications on File Prior to Visit  Medication Dose Route Frequency Provider Last Rate Last Admin  . betamethasone acetate-betamethasone sodium phosphate (CELESTONE) injection 12 mg  12 mg Other Once Magnus Sinning, MD        ALLERGIES: Tobramycin-dexamethasone, Ciprofloxacin, and Codeine  Family History  Problem Relation Age of Onset  . Cancer Mother 24       uterian cancer   . Stroke Father     SH:  Married, non smoker  Review of Systems  Constitutional: Negative.   HENT: Negative.   Eyes: Negative.   Respiratory: Negative.   Cardiovascular: Negative.   Gastrointestinal: Negative.   Endocrine: Negative.   Genitourinary: Positive for frequency.  Musculoskeletal: Negative.   Skin: Negative.   Allergic/Immunologic: Negative.   Neurological: Negative.   Hematological: Negative.   Psychiatric/Behavioral: Negative.     PHYSICAL EXAMINATION:    BP 124/80   Pulse 68   Resp 16   Wt 166 lb (75.3 kg)   LMP 07/25/1994   BMI 26.00 kg/m     General appearance:  alert, cooperative and appears stated age Flank:  No CVA tenderness Abdomen: soft, non-tender; bowel sounds normal; no masses,  no organomegaly Lymph:  no inguinal LAD noted  Pelvic: External genitalia:  no lesions              Urethra:  normal appearing urethra with no masses, tenderness or lesions              Bartholins and Skenes: normal                 Vagina: normal appearing vagina with normal color and scant discharge noted, no lesions  Cervix: no lesions              Bimanual Exam:  Uterus:  normal size, contour, position, consistency, mobility, non-tender              Adnexa: no mass, fullness, tenderness              Pt declined a chaperone.  Assessment: Urinary frequency and urgency H/o recurrent UTIs Renal insufficiency, creatinine clearance calculated today:  29.3 (from creatine level on 11/29/2019) Long standing type I diabetes Scant vaginal discharge  Plan: Affirm obtained  Urine culture pending Renally dosed bactrim to pharmacy for her.  SS bactrim, 2 tabs x 1 then 1 tab every 12 hours to complete 6 doses.  #7/0RF

## 2019-12-30 NOTE — Telephone Encounter (Signed)
Possible infection - please call to verify if she is having symptoms. Thank you

## 2019-12-30 NOTE — Telephone Encounter (Signed)
Spoke with patient. Patient reports urinary frequency, voiding small amounts for the past 1-2 days. Reports mild cramping in lower abdomen.  Hx of stage 4 kidney disease. Denies any other symptoms. Advised OV needed for further evaluation, patient agreeable. OV scheduled for today at 4pm with Dr. Sabra Heck.   Last UC proteus mirabilis 09/07/19  Rx refused.  Encounter closed.

## 2019-12-31 LAB — VAGINITIS/VAGINOSIS, DNA PROBE
Candida Species: NEGATIVE
Gardnerella vaginalis: NEGATIVE
Trichomonas vaginosis: NEGATIVE

## 2020-01-01 LAB — URINE CULTURE

## 2020-01-02 ENCOUNTER — Telehealth: Payer: Self-pay

## 2020-01-02 ENCOUNTER — Encounter: Payer: Self-pay | Admitting: Obstetrics & Gynecology

## 2020-01-02 DIAGNOSIS — K6389 Other specified diseases of intestine: Secondary | ICD-10-CM | POA: Insufficient documentation

## 2020-01-02 NOTE — Telephone Encounter (Signed)
-----   Message from Megan Salon, MD sent at 01/01/2020 11:12 AM EDT ----- Please let pt know her vaginitis testing was negative and the urine culture is negative.  She is so susceptible to infections and I did do the renal dosing for her bactrim, I'd just like her to finish it and give update when done.  Thanks.

## 2020-01-02 NOTE — Telephone Encounter (Signed)
Patient notified of results as written by provider 

## 2020-01-02 NOTE — Telephone Encounter (Signed)
Patient returned a call to [facility].

## 2020-01-02 NOTE — Telephone Encounter (Signed)
Left message for call back.

## 2020-01-03 ENCOUNTER — Telehealth: Payer: Self-pay

## 2020-01-03 NOTE — Telephone Encounter (Signed)
Left message to call Tayt Moyers, RN at [facility] [phone].   

## 2020-01-03 NOTE — Telephone Encounter (Signed)
Patient is returning call.  °

## 2020-01-03 NOTE — Telephone Encounter (Signed)
Spoke with patient. Patient was seen in the office for UTI symptoms on 12/30/19, Tx with renal dosing SS bactrim. Patient has completed abx, calling to provide update.   12/30/19 - urine culture and vaginitis testing negative.  Reports all symptoms have resolved. Reports some mild cramps in her lower back that started on 8/9. Patient has contacted her PCP, Dr. Osborne Casco and nephrologist, Dr. Kris Mouton, for recommendations and f/u, is awaiting return call.   Advised patient I will provide update to Dr. Sabra Heck, advised she is out of the office this week, will return on 8/16. Will have one of the covering providers review, our office will return call if any additional recommendations. Patient verbalizes understanding and is agreeable.   Routing to covering provider for final review. Patient is agreeable to disposition. Will close encounter.  Cc: Dr. Sabra Heck

## 2020-01-03 NOTE — Telephone Encounter (Signed)
Patient left message wanting to update doctor on symptoms.

## 2020-01-05 DIAGNOSIS — Z794 Long term (current) use of insulin: Secondary | ICD-10-CM | POA: Diagnosis not present

## 2020-01-05 DIAGNOSIS — H401134 Primary open-angle glaucoma, bilateral, indeterminate stage: Secondary | ICD-10-CM | POA: Diagnosis not present

## 2020-01-05 DIAGNOSIS — Z79899 Other long term (current) drug therapy: Secondary | ICD-10-CM | POA: Diagnosis not present

## 2020-01-05 DIAGNOSIS — Z961 Presence of intraocular lens: Secondary | ICD-10-CM | POA: Diagnosis not present

## 2020-01-05 DIAGNOSIS — E103593 Type 1 diabetes mellitus with proliferative diabetic retinopathy without macular edema, bilateral: Secondary | ICD-10-CM | POA: Diagnosis not present

## 2020-01-05 DIAGNOSIS — H35373 Puckering of macula, bilateral: Secondary | ICD-10-CM | POA: Diagnosis not present

## 2020-01-06 ENCOUNTER — Ambulatory Visit
Admission: RE | Admit: 2020-01-06 | Discharge: 2020-01-06 | Disposition: A | Discharge status: Home / Self Care | Payer: Medicare Other | Source: Ambulatory Visit | Attending: Internal Medicine | Admitting: Internal Medicine

## 2020-01-06 ENCOUNTER — Other Ambulatory Visit: Payer: Self-pay

## 2020-01-06 DIAGNOSIS — Z1231 Encounter for screening mammogram for malignant neoplasm of breast: Secondary | ICD-10-CM

## 2020-01-10 DIAGNOSIS — Z4681 Encounter for fitting and adjustment of insulin pump: Secondary | ICD-10-CM | POA: Diagnosis not present

## 2020-01-10 DIAGNOSIS — E10319 Type 1 diabetes mellitus with unspecified diabetic retinopathy without macular edema: Secondary | ICD-10-CM | POA: Diagnosis not present

## 2020-01-10 DIAGNOSIS — I129 Hypertensive chronic kidney disease with stage 1 through stage 4 chronic kidney disease, or unspecified chronic kidney disease: Secondary | ICD-10-CM | POA: Diagnosis not present

## 2020-01-10 DIAGNOSIS — Z794 Long term (current) use of insulin: Secondary | ICD-10-CM | POA: Diagnosis not present

## 2020-01-10 DIAGNOSIS — N184 Chronic kidney disease, stage 4 (severe): Secondary | ICD-10-CM | POA: Diagnosis not present

## 2020-02-02 ENCOUNTER — Other Ambulatory Visit: Payer: Self-pay | Admitting: Obstetrics & Gynecology

## 2020-02-06 DIAGNOSIS — H18423 Band keratopathy, bilateral: Secondary | ICD-10-CM | POA: Diagnosis not present

## 2020-02-06 DIAGNOSIS — Z961 Presence of intraocular lens: Secondary | ICD-10-CM | POA: Diagnosis not present

## 2020-02-06 DIAGNOSIS — H401134 Primary open-angle glaucoma, bilateral, indeterminate stage: Secondary | ICD-10-CM | POA: Diagnosis not present

## 2020-02-07 DIAGNOSIS — E1040 Type 1 diabetes mellitus with diabetic neuropathy, unspecified: Secondary | ICD-10-CM | POA: Diagnosis not present

## 2020-02-07 DIAGNOSIS — Z23 Encounter for immunization: Secondary | ICD-10-CM | POA: Diagnosis not present

## 2020-02-07 DIAGNOSIS — N2581 Secondary hyperparathyroidism of renal origin: Secondary | ICD-10-CM | POA: Diagnosis not present

## 2020-02-07 DIAGNOSIS — E103593 Type 1 diabetes mellitus with proliferative diabetic retinopathy without macular edema, bilateral: Secondary | ICD-10-CM | POA: Diagnosis not present

## 2020-02-07 DIAGNOSIS — I129 Hypertensive chronic kidney disease with stage 1 through stage 4 chronic kidney disease, or unspecified chronic kidney disease: Secondary | ICD-10-CM | POA: Diagnosis not present

## 2020-02-07 DIAGNOSIS — Z4681 Encounter for fitting and adjustment of insulin pump: Secondary | ICD-10-CM | POA: Diagnosis not present

## 2020-02-07 DIAGNOSIS — N184 Chronic kidney disease, stage 4 (severe): Secondary | ICD-10-CM | POA: Diagnosis not present

## 2020-02-07 DIAGNOSIS — E7800 Pure hypercholesterolemia, unspecified: Secondary | ICD-10-CM | POA: Diagnosis not present

## 2020-02-07 DIAGNOSIS — E039 Hypothyroidism, unspecified: Secondary | ICD-10-CM | POA: Diagnosis not present

## 2020-02-07 DIAGNOSIS — Z794 Long term (current) use of insulin: Secondary | ICD-10-CM | POA: Diagnosis not present

## 2020-02-07 DIAGNOSIS — E1022 Type 1 diabetes mellitus with diabetic chronic kidney disease: Secondary | ICD-10-CM | POA: Diagnosis not present

## 2020-02-07 DIAGNOSIS — D631 Anemia in chronic kidney disease: Secondary | ICD-10-CM | POA: Diagnosis not present

## 2020-03-01 DIAGNOSIS — Z23 Encounter for immunization: Secondary | ICD-10-CM | POA: Diagnosis not present

## 2020-03-08 DIAGNOSIS — N184 Chronic kidney disease, stage 4 (severe): Secondary | ICD-10-CM | POA: Diagnosis not present

## 2020-03-16 DIAGNOSIS — N184 Chronic kidney disease, stage 4 (severe): Secondary | ICD-10-CM | POA: Diagnosis not present

## 2020-03-20 DIAGNOSIS — S52211A Greenstick fracture of shaft of right ulna, initial encounter for closed fracture: Secondary | ICD-10-CM | POA: Diagnosis not present

## 2020-03-20 DIAGNOSIS — M25531 Pain in right wrist: Secondary | ICD-10-CM | POA: Diagnosis not present

## 2020-03-26 DIAGNOSIS — S52211A Greenstick fracture of shaft of right ulna, initial encounter for closed fracture: Secondary | ICD-10-CM | POA: Diagnosis not present

## 2020-03-26 DIAGNOSIS — M25531 Pain in right wrist: Secondary | ICD-10-CM | POA: Diagnosis not present

## 2020-04-10 DIAGNOSIS — S52224D Nondisplaced transverse fracture of shaft of right ulna, subsequent encounter for closed fracture with routine healing: Secondary | ICD-10-CM | POA: Diagnosis not present

## 2020-05-08 DIAGNOSIS — E039 Hypothyroidism, unspecified: Secondary | ICD-10-CM | POA: Diagnosis not present

## 2020-05-08 DIAGNOSIS — Z0000 Encounter for general adult medical examination without abnormal findings: Secondary | ICD-10-CM | POA: Diagnosis not present

## 2020-05-08 DIAGNOSIS — E1040 Type 1 diabetes mellitus with diabetic neuropathy, unspecified: Secondary | ICD-10-CM | POA: Diagnosis not present

## 2020-05-10 DIAGNOSIS — S52224D Nondisplaced transverse fracture of shaft of right ulna, subsequent encounter for closed fracture with routine healing: Secondary | ICD-10-CM | POA: Diagnosis not present

## 2020-05-15 DIAGNOSIS — E1022 Type 1 diabetes mellitus with diabetic chronic kidney disease: Secondary | ICD-10-CM | POA: Diagnosis not present

## 2020-05-15 DIAGNOSIS — G43709 Chronic migraine without aura, not intractable, without status migrainosus: Secondary | ICD-10-CM | POA: Diagnosis not present

## 2020-05-15 DIAGNOSIS — E103593 Type 1 diabetes mellitus with proliferative diabetic retinopathy without macular edema, bilateral: Secondary | ICD-10-CM | POA: Diagnosis not present

## 2020-05-15 DIAGNOSIS — N184 Chronic kidney disease, stage 4 (severe): Secondary | ICD-10-CM | POA: Diagnosis not present

## 2020-05-15 DIAGNOSIS — Z0000 Encounter for general adult medical examination without abnormal findings: Secondary | ICD-10-CM | POA: Diagnosis not present

## 2020-05-15 DIAGNOSIS — E1040 Type 1 diabetes mellitus with diabetic neuropathy, unspecified: Secondary | ICD-10-CM | POA: Diagnosis not present

## 2020-05-15 DIAGNOSIS — N2581 Secondary hyperparathyroidism of renal origin: Secondary | ICD-10-CM | POA: Diagnosis not present

## 2020-05-15 DIAGNOSIS — Z794 Long term (current) use of insulin: Secondary | ICD-10-CM | POA: Diagnosis not present

## 2020-05-15 DIAGNOSIS — E7800 Pure hypercholesterolemia, unspecified: Secondary | ICD-10-CM | POA: Diagnosis not present

## 2020-05-15 DIAGNOSIS — I129 Hypertensive chronic kidney disease with stage 1 through stage 4 chronic kidney disease, or unspecified chronic kidney disease: Secondary | ICD-10-CM | POA: Diagnosis not present

## 2020-05-15 DIAGNOSIS — E039 Hypothyroidism, unspecified: Secondary | ICD-10-CM | POA: Diagnosis not present

## 2020-05-15 DIAGNOSIS — D631 Anemia in chronic kidney disease: Secondary | ICD-10-CM | POA: Diagnosis not present

## 2020-06-22 IMAGING — MR MRI LUMBAR SPINE WITHOUT CONTRAST
4 of 5 series · 26 of 48 positions shown · non-contrast
Comparison: Lumbar spine x-rays dated September 09, 2018.

CLINICAL DATA: Chronic low back pain radiating into the left
buttock and leg.

EXAM:
MRI LUMBAR SPINE WITHOUT CONTRAST
TECHNIQUE: Multiplanar, multisequence MR imaging of the lumbar spine was
performed. No intravenous contrast was administered.

[Series 3: T2 post-contrast · sagittal · 4.0mm · 0.55mm/px · 5 of 13 slices shown]
[im 1/13]
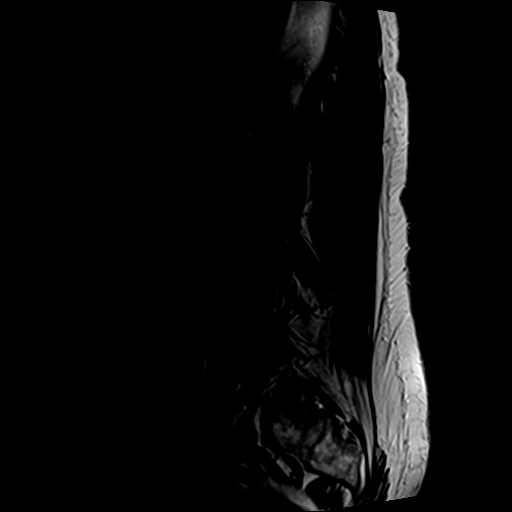
[im 4/13]
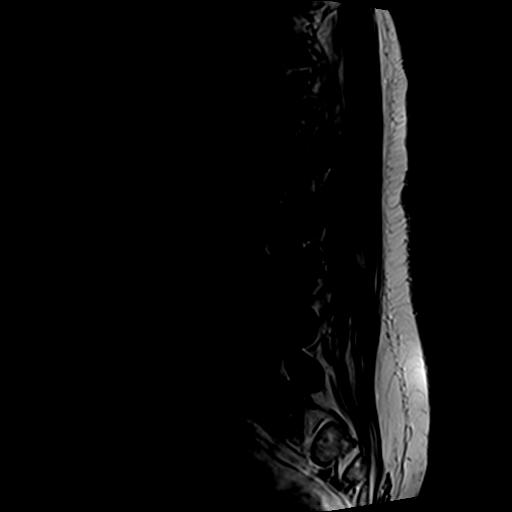
[im 7/13]
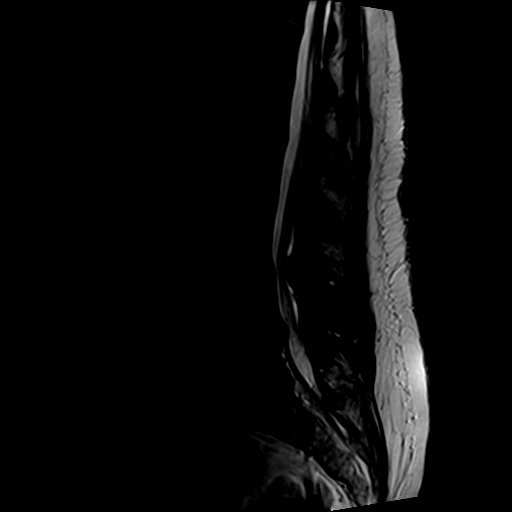
[im 10/13]
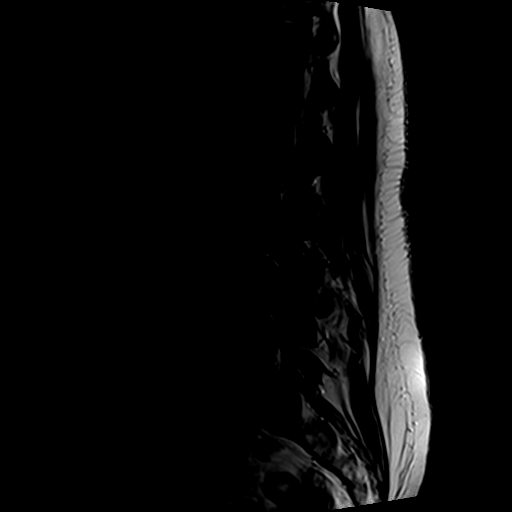
[im 13/13]
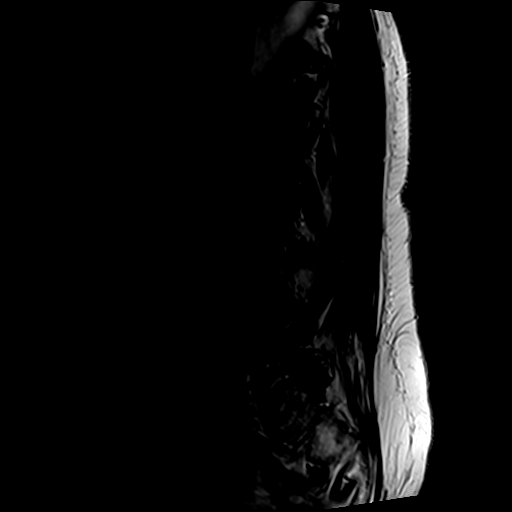

[Series 5: T1 · sagittal · 4.0mm · 0.55mm/px · 6 of 13 slices shown (1 of 2)]
[im 1/13]
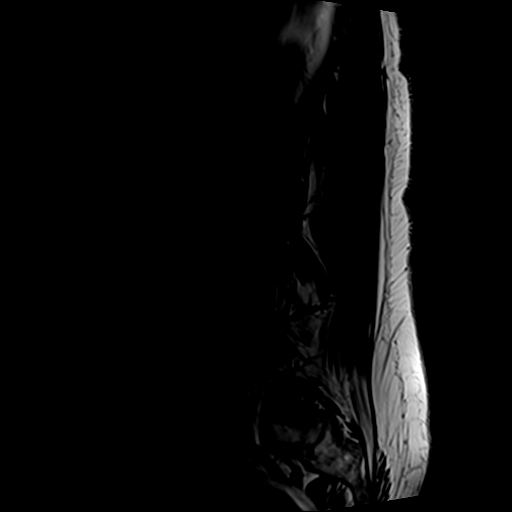
[im 3/13]
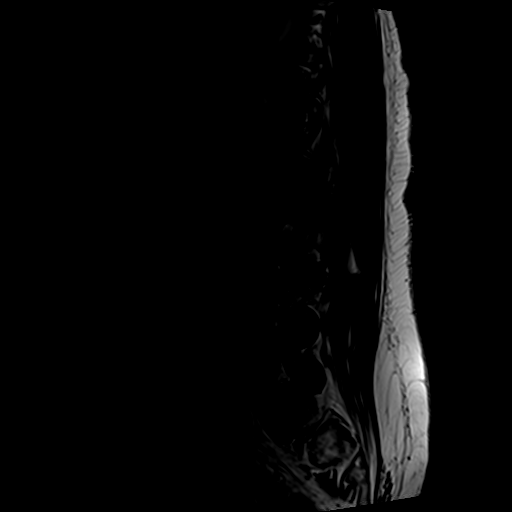
[im 5/13]
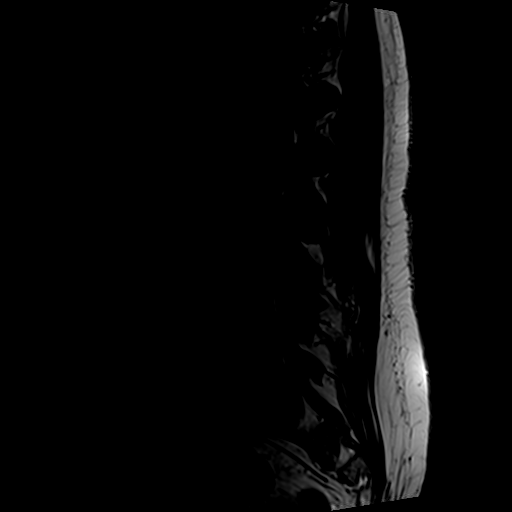
[im 8/13]
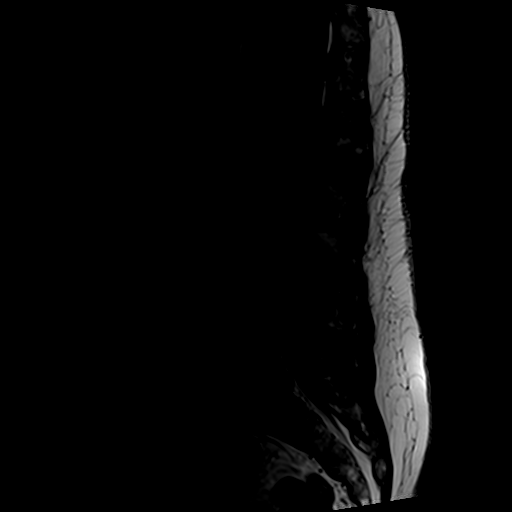
[im 10/13]
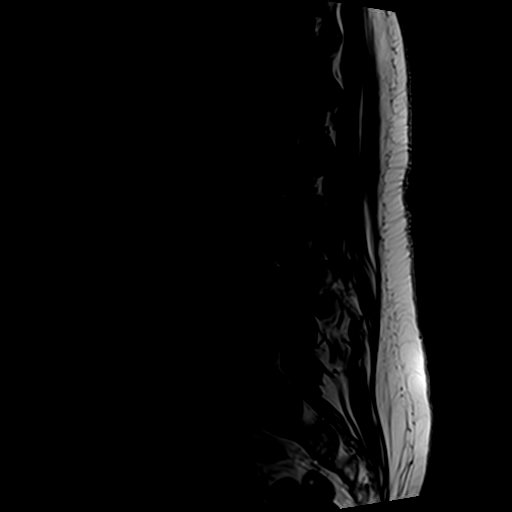
[im 13/13]
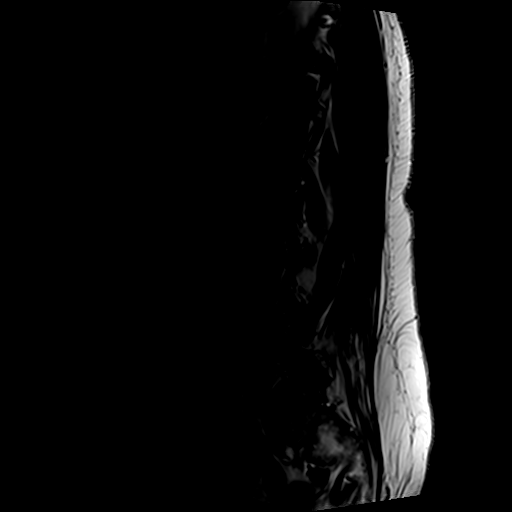

[Series 6: T1 · axial · 4.0mm · 0.35mm/px · z∈[-65,+102]mm · 5 of 36 slices shown (2 of 2)]
[im 3/36]
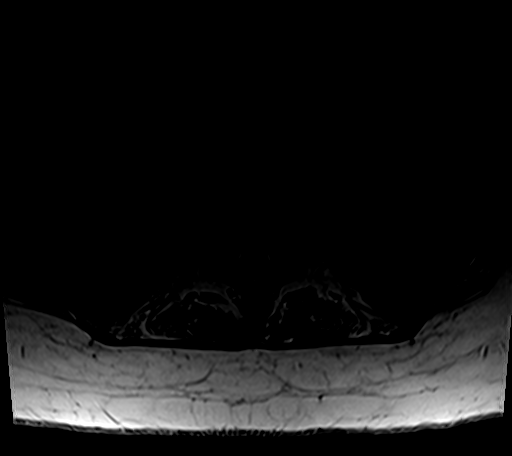
[im 5/36]
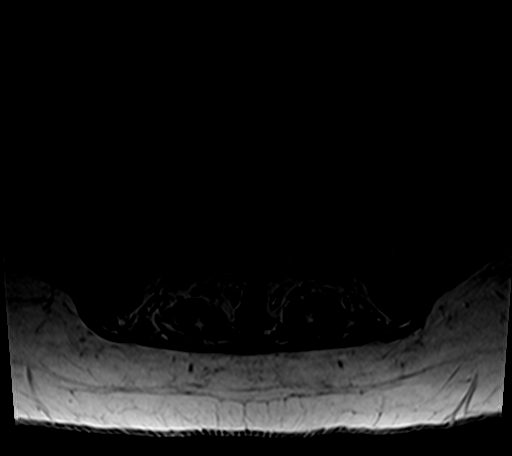
[im 8/36]
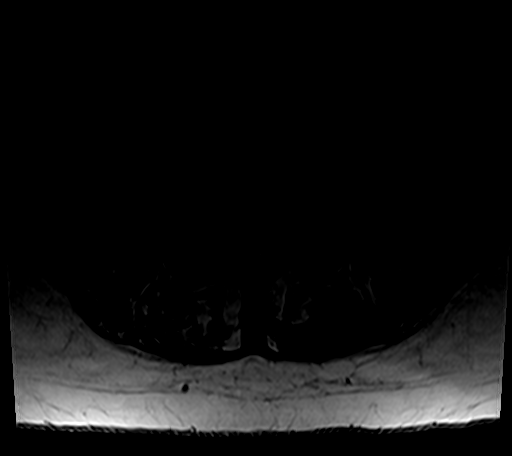
[im 19/36]
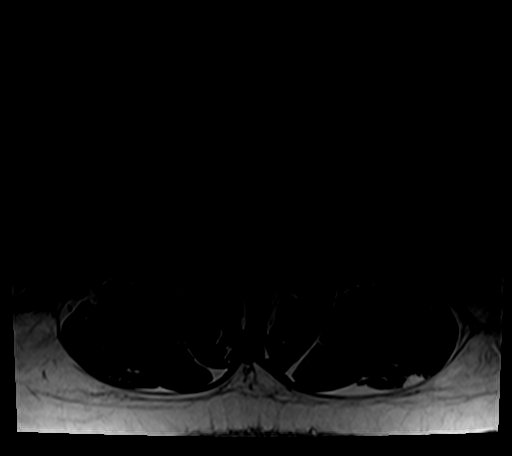
[im 31/36]
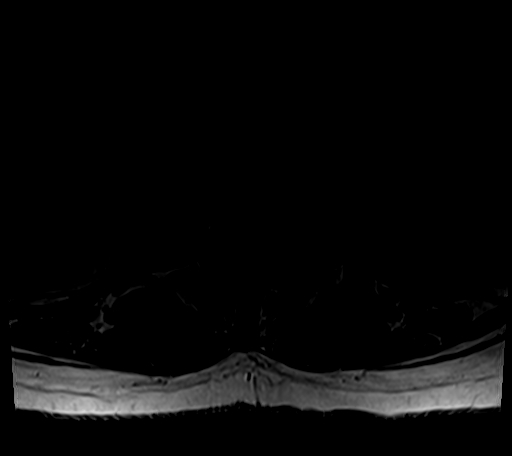

[Series 8: T2 · axial · 4.0mm · 0.70mm/px · z∈[-65,+140]mm · 10 of 36 slices shown]
[im 3/36]
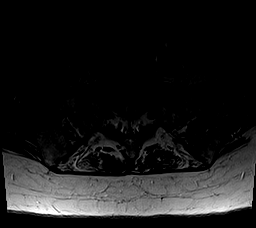
[im 5/36]
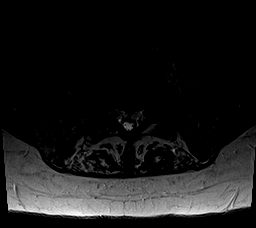
[im 8/36]
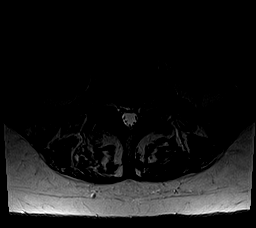
[im 12/36]
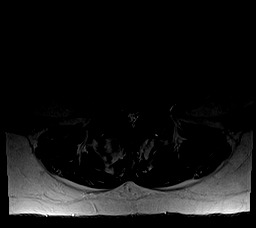
[im 17/36]
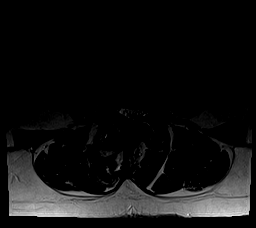
[im 19/36]
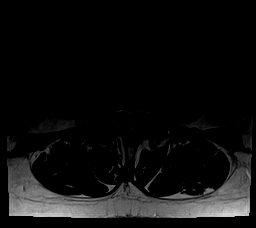
[im 22/36]
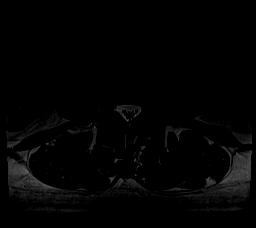
[im 26/36]
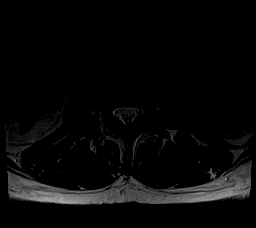
[im 31/36]
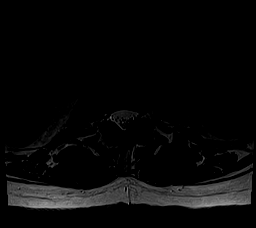
[im 36/36]
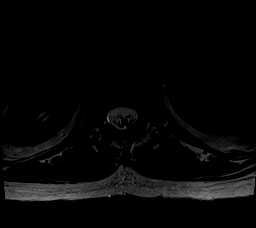

[26 of 48 positions shown; findings below may reference images not displayed]

FINDINGS: Segmentation:  Standard.

Alignment:  2 mm anterolisthesis at L3-L4.

Vertebrae:  No fracture, evidence of discitis, or bone lesion.

Conus medullaris and cauda equina: Conus extends to the L1 level.
Conus and cauda equina appear normal.

Paraspinal and other soft tissues: Negative.

Disc levels:

T12-L1:  Negative.

L1-L2: Negative disc. Mild left ligamentum flavum hypertrophy. No
stenosis.

L2-L3: Tiny biforaminal disc protrusions. Mild bilateral facet
arthropathy with ligamentum flavum hypertrophy and tiny 2 mm left
synovial cyst. No stenosis.

L3-L4: Mild disc bulging. Moderate to severe bilateral facet
arthropathy. 1.1 cm synovial cyst in the posterior paraspinous soft
tissues arising from the right facet joint. Moderate to severe
spinal canal stenosis. Mild to moderate left and mild right lateral
recess stenosis. Mild bilateral neuroforaminal stenosis.

L4-L5: Mild disc bulging with superimposed central and left
subarticular disc protrusion. Mild bilateral facet arthropathy.
Severe left and moderate to severe right lateral recess stenosis.
Mild-to-moderate spinal canal stenosis. Mild right neuroforaminal
stenosis. No left neuroforaminal stenosis.

L5-S1: Central and right paracentral disc protrusion with annular
fissure. Mild bilateral facet arthropathy. Mild right lateral recess
stenosis. No spinal canal or neuroforaminal stenosis.
IMPRESSION: 1. Multilevel lumbar spondylosis as described above. Symptomatic
level likely L4-L5 where there is severe left lateral recess
stenosis affecting the descending left L5 nerve root. Additional
moderate to severe right lateral recess stenosis at this level could
affect the descending right L5 nerve root.
2. Additional moderate to severe spinal canal stenosis and
mild-to-moderate left neuroforaminal stenosis at L3-L4.

## 2020-09-27 ENCOUNTER — Other Ambulatory Visit: Payer: Self-pay | Admitting: Internal Medicine

## 2020-09-27 DIAGNOSIS — Z1231 Encounter for screening mammogram for malignant neoplasm of breast: Secondary | ICD-10-CM

## 2021-01-07 ENCOUNTER — Ambulatory Visit: Payer: Medicare Other
